# Patient Record
Sex: Female | Born: 1975 | Race: White | Hispanic: No | Marital: Married | Smoking: Current every day smoker
Health system: Southern US, Community
[De-identification: ages and names within clinical notes are randomized; demographics above are authoritative.]

## PROBLEM LIST (undated history)

## (undated) DIAGNOSIS — F329 Major depressive disorder, single episode, unspecified: Secondary | ICD-10-CM

## (undated) DIAGNOSIS — F419 Anxiety disorder, unspecified: Secondary | ICD-10-CM

## (undated) DIAGNOSIS — F32A Depression, unspecified: Secondary | ICD-10-CM

## (undated) DIAGNOSIS — R112 Nausea with vomiting, unspecified: Secondary | ICD-10-CM

## (undated) DIAGNOSIS — I10 Essential (primary) hypertension: Secondary | ICD-10-CM

## (undated) HISTORY — PX: ABDOMINAL HYSTERECTOMY: SHX81

## (undated) HISTORY — PX: OOPHORECTOMY: SHX86

---

## 2006-03-30 ENCOUNTER — Ambulatory Visit: Payer: Self-pay | Admitting: Gynecology

## 2006-03-30 ENCOUNTER — Encounter (INDEPENDENT_AMBULATORY_CARE_PROVIDER_SITE_OTHER): Payer: Self-pay | Admitting: Gynecology

## 2006-07-07 ENCOUNTER — Emergency Department: Payer: Self-pay | Admitting: Emergency Medicine

## 2006-07-07 ENCOUNTER — Other Ambulatory Visit: Payer: Self-pay

## 2007-11-22 ENCOUNTER — Encounter (INDEPENDENT_AMBULATORY_CARE_PROVIDER_SITE_OTHER): Payer: Self-pay | Admitting: Gynecology

## 2007-11-22 ENCOUNTER — Ambulatory Visit: Payer: Self-pay | Admitting: Gynecology

## 2009-09-12 ENCOUNTER — Ambulatory Visit: Payer: Self-pay | Admitting: Obstetrics and Gynecology

## 2009-09-18 ENCOUNTER — Ambulatory Visit: Payer: Self-pay | Admitting: Obstetrics and Gynecology

## 2010-09-16 ENCOUNTER — Emergency Department: Payer: Self-pay | Admitting: Internal Medicine

## 2011-02-11 ENCOUNTER — Ambulatory Visit: Payer: Self-pay | Admitting: Internal Medicine

## 2011-12-31 ENCOUNTER — Ambulatory Visit: Payer: Self-pay | Admitting: Family Medicine

## 2011-12-31 LAB — CBC WITH DIFFERENTIAL/PLATELET
Eosinophil #: 0 10*3/uL (ref 0.0–0.7)
Eosinophil %: 0.5 %
Lymphocyte %: 27.2 %
MCH: 30.4 pg (ref 26.0–34.0)
MCV: 89 fL (ref 80–100)
Monocyte %: 5 %
Neutrophil #: 5.8 10*3/uL (ref 1.4–6.5)
Neutrophil %: 66.7 %
Platelet: 315 10*3/uL (ref 150–440)
RDW: 13.2 % (ref 11.5–14.5)
WBC: 8.7 10*3/uL (ref 3.6–11.0)

## 2012-06-30 IMAGING — CT CT CHEST W/ CM
1 series · 16 of 32 positions shown, 20 images · IV contrast (APPLIED)
Comparison: None

REASON FOR EXAM: sob
COMMENTS:

PROCEDURE:     CT  - CT CHEST (FOR PE) W  - September 16, 2010  [DATE]
RESULT:     Indications: Shortness of breath
TECHNIQUE: A thin-section spiral CT from the lung apices to the upper
abdomen was acquired on a multi slice scanner following 73 ml [id number]
intravenous contrast. These images were then transferred to the Siemens work
station and were subsequently reviewed utilizing 3-D reconstructions and MIP
images.

[Series 5: soft tissue · axial · 0.69mm/px · z∈[-278,-14]mm · 16 of 96 slices shown, 20 images]
[im 4/96  mediastinal]
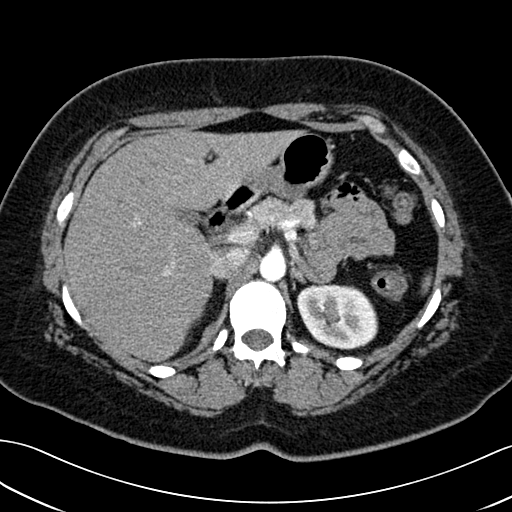
[im 4/96  lung]
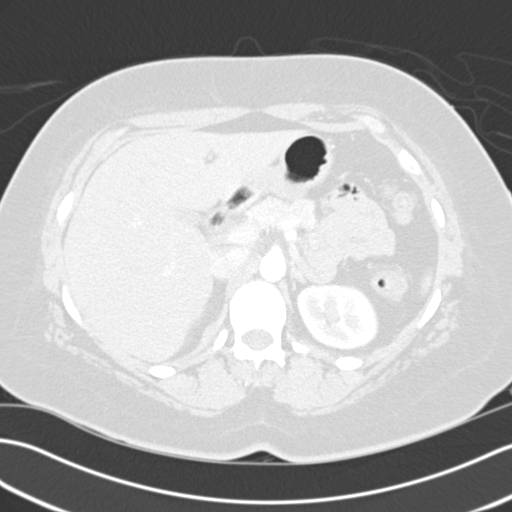
[im 11/96  lung]
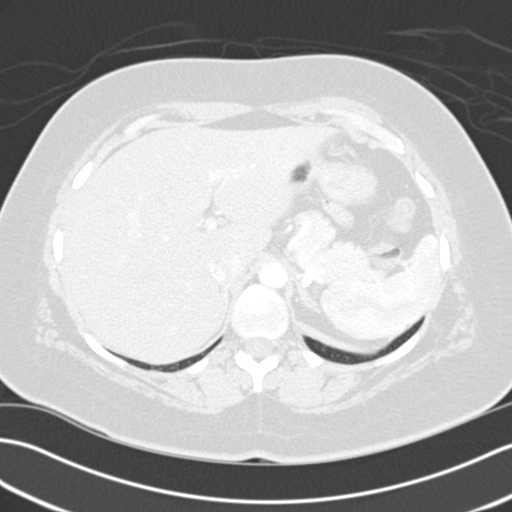
[im 18/96  lung]
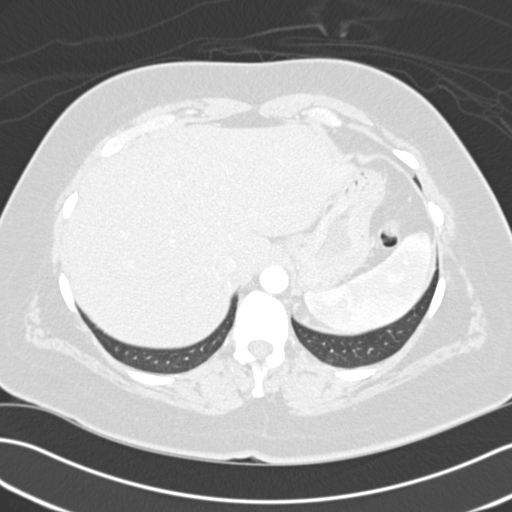
[im 22/96  lung]
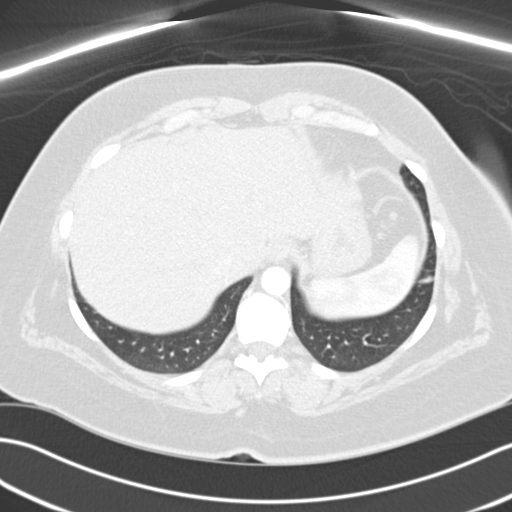
[im 29/96  mediastinal]
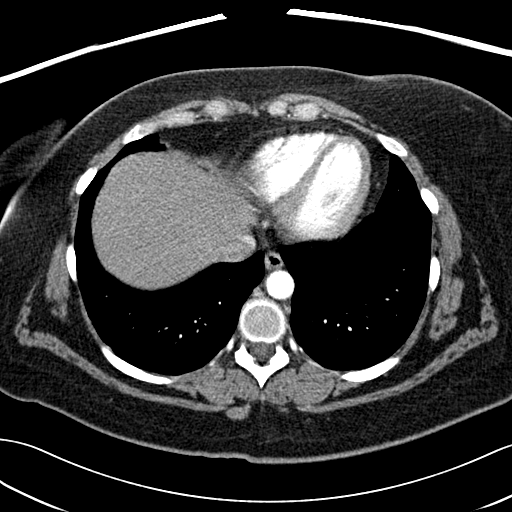
[im 29/96  lung]
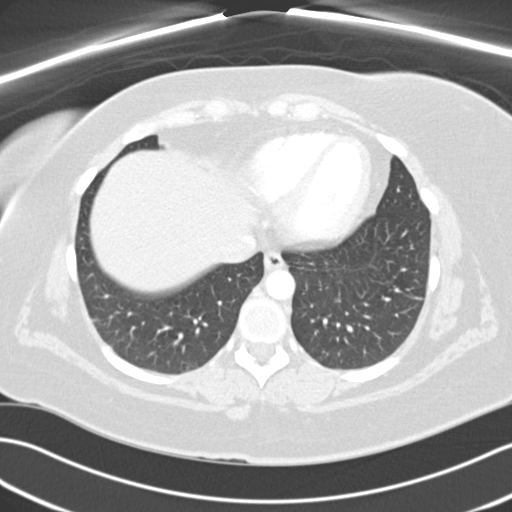
[im 36/96  lung]
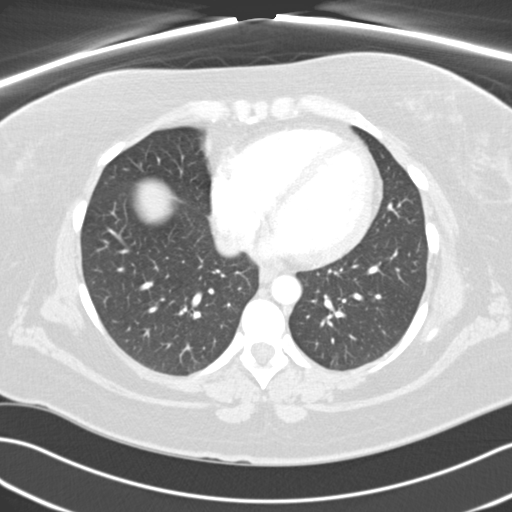
[im 43/96  lung]
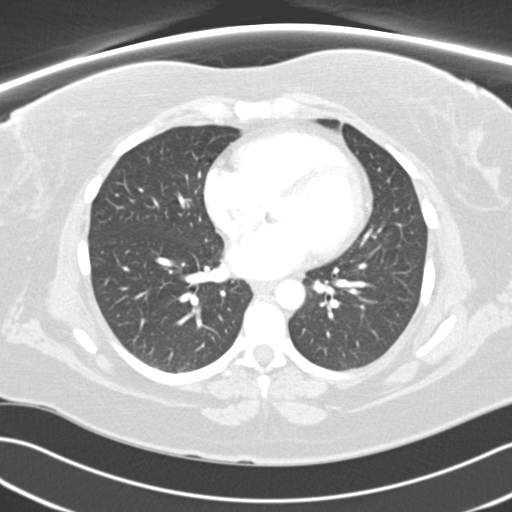
[im 50/96  lung]
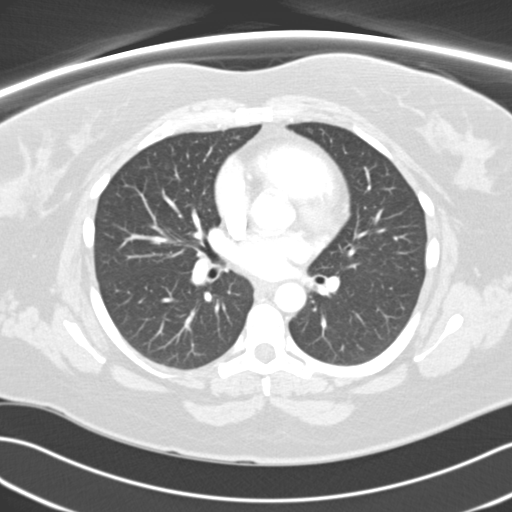
[im 51/96  mediastinal]
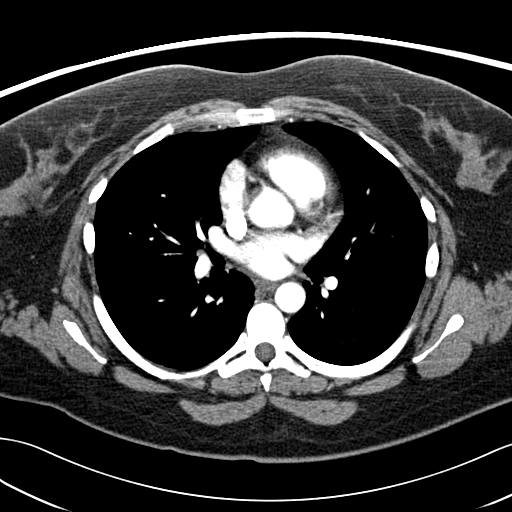
[im 51/96  lung]
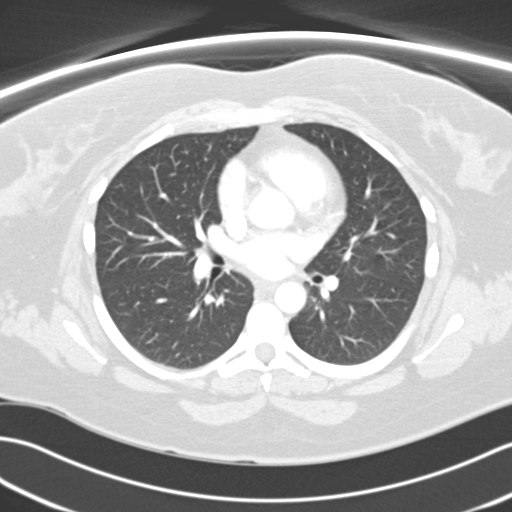
[im 57/96  lung]
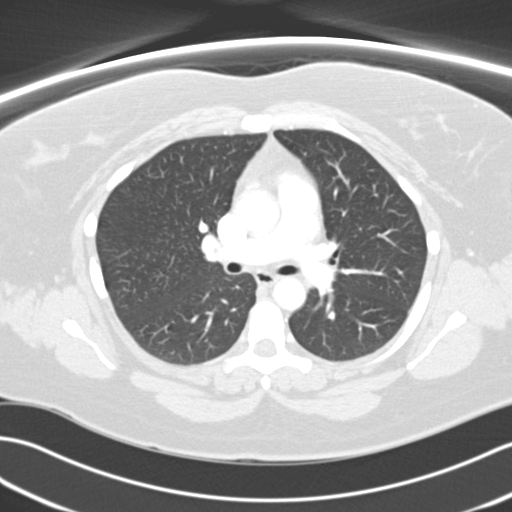
[im 60/96  lung]
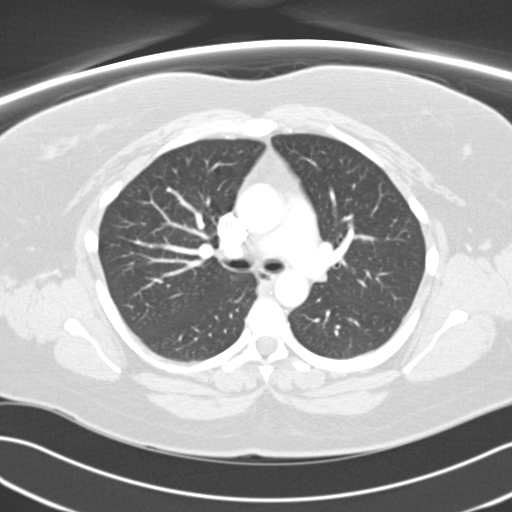
[im 67/96  lung]
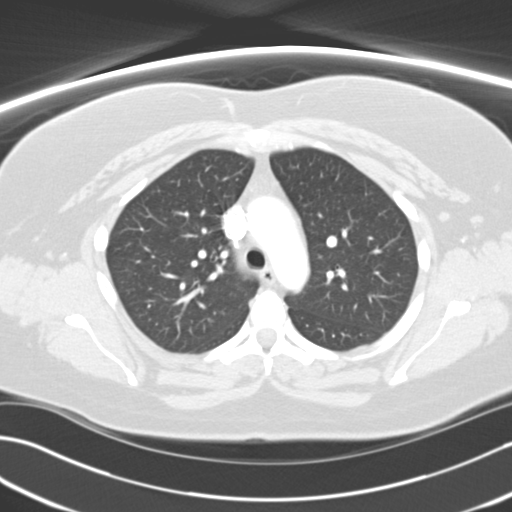
[im 74/96  mediastinal]
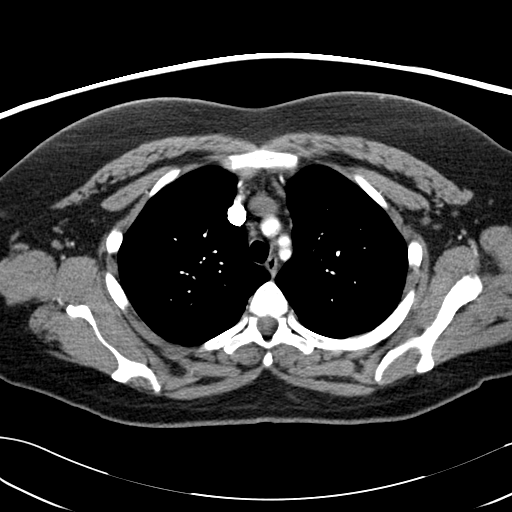
[im 74/96  lung]
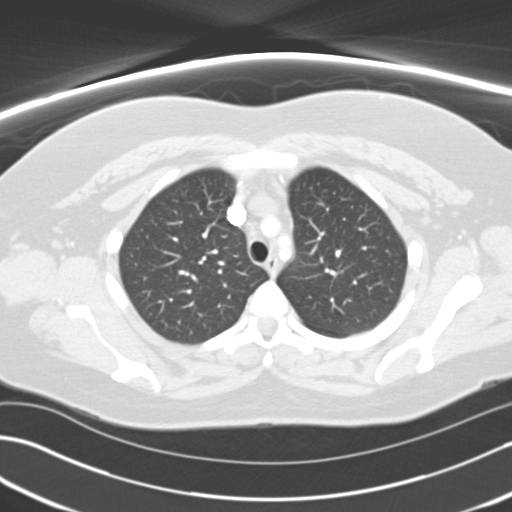
[im 78/96  lung]
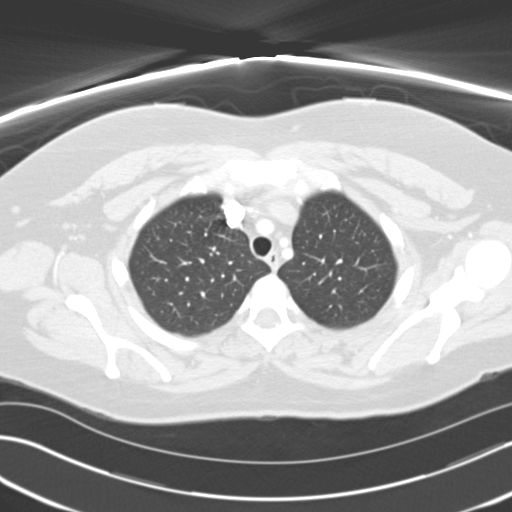
[im 85/96  lung]
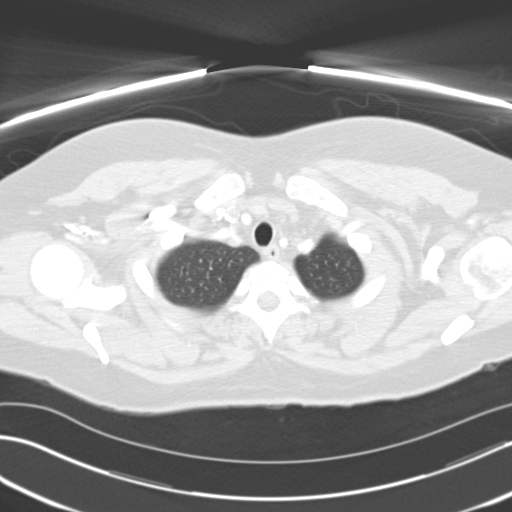
[im 92/96  lung]
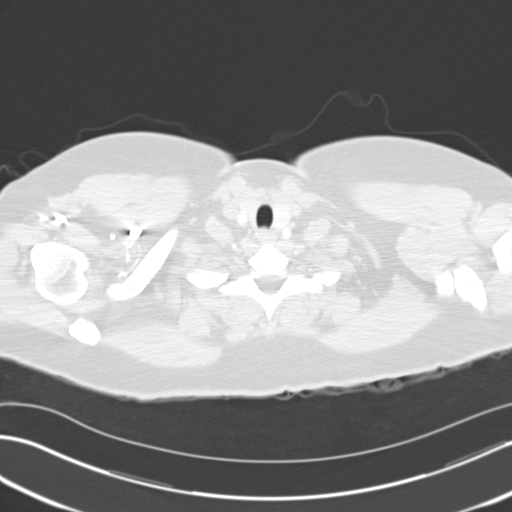

[16 of 32 positions shown; findings below may reference images not displayed]

FINDINGS: There is adequate opacification of the pulmonary arteries. There is no
pulmonary embolus. The main pulmonary artery, right main pulmonary artery,
and left main pulmonary arteries are normal in size. The heart size is
normal. There is no pericardial effusion.

The lungs are clear. There is no focal consolidation, pleural effusion, or
pneumothorax.

There is no axillary, hilar, or mediastinal adenopathy.

The osseous structures are unremarkable.

The visualized portions of the upper abdomen are unremarkable.
IMPRESSION: 1. No CT evidence of pulmonary embolus.

## 2012-06-30 IMAGING — CR DG CHEST 2V
1 series · 2 of 2 positions shown · non-contrast
Comparison: none

REASON FOR EXAM: SOB
COMMENTS:

[Series 1: view not recorded · 0.17mm/px · 2 of 2 slices shown]
[im 1/2]
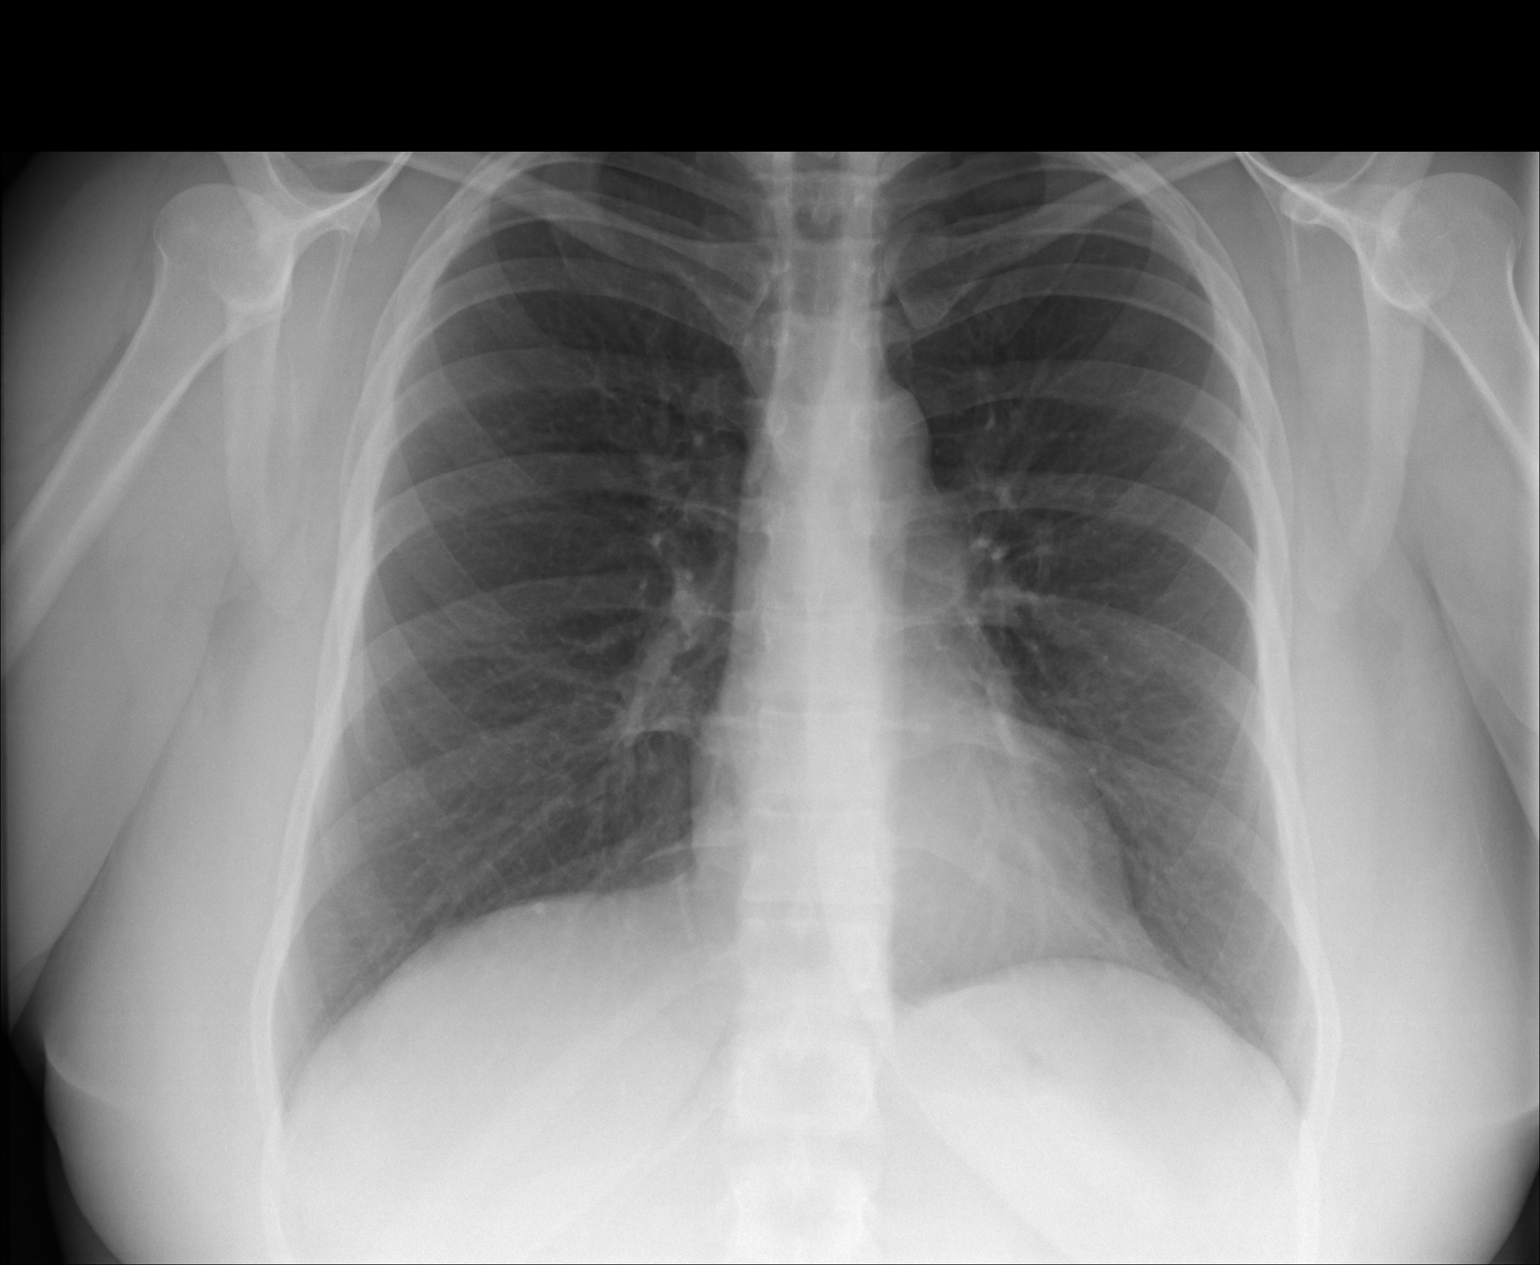
[im 2/2]
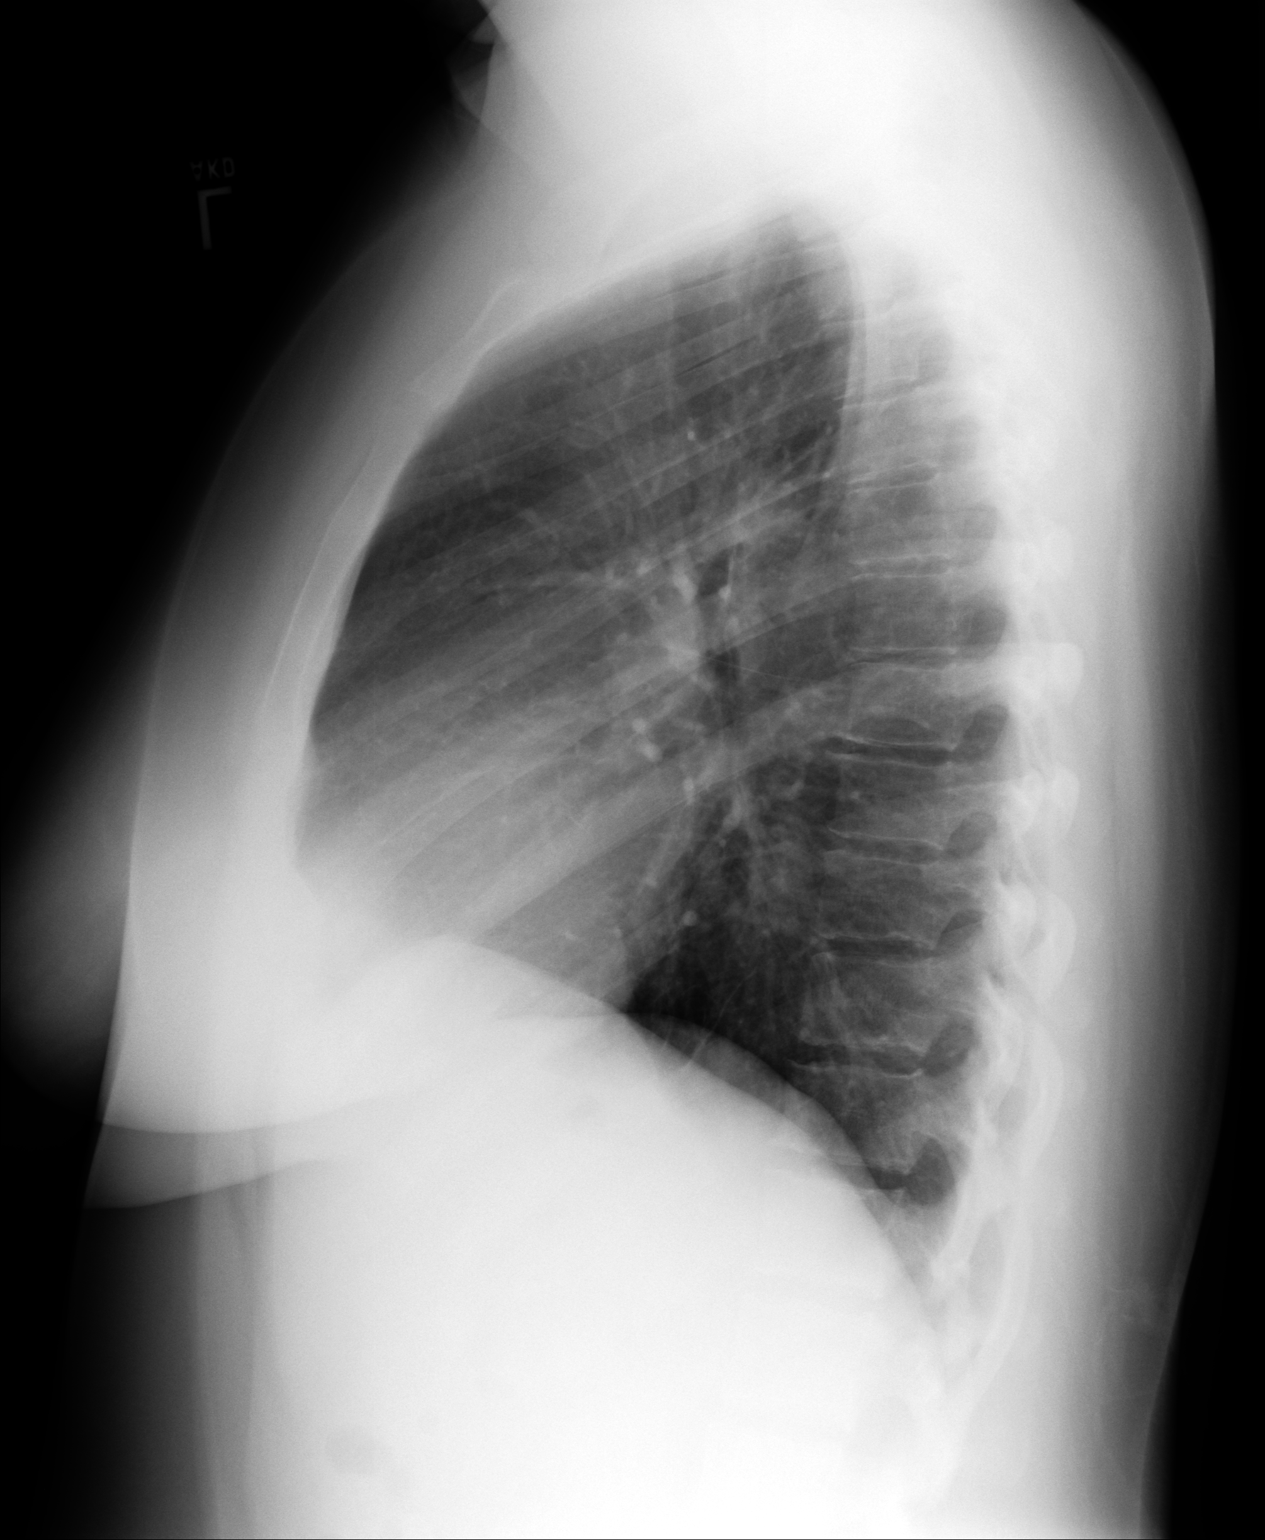

[2 of 2 positions shown; findings below may reference images not displayed]

PROCEDURE:     DXR - DXR CHEST PA (OR AP) AND LATERAL  - September 16, 2010 [DATE]

RESULT:     There is no previous exam for comparison.

The lungs are clear. The heart and pulmonary vessels are normal. The bony
and mediastinal structures are unremarkable. There is no effusion. There is
no pneumothorax or evidence of congestive failure.
IMPRESSION: No acute cardiopulmonary disease.

## 2012-10-18 ENCOUNTER — Ambulatory Visit: Payer: Self-pay

## 2012-12-20 ENCOUNTER — Inpatient Hospital Stay: Payer: Self-pay | Admitting: Psychiatry

## 2012-12-20 LAB — COMPREHENSIVE METABOLIC PANEL
Albumin: 3.9 g/dL (ref 3.4–5.0)
BUN: 9 mg/dL (ref 7–18)
Co2: 28 mmol/L (ref 21–32)
EGFR (African American): >60
EGFR (Non-African Amer.): >60
Osmolality: 276 (ref 275–301)
Potassium: 4 mmol/L (ref 3.5–5.1)
Total Protein: 7.7 g/dL (ref 6.4–8.2)

## 2012-12-20 LAB — CBC
HCT: 41.4 % (ref 35.0–47.0)
MCH: 30.2 pg (ref 26.0–34.0)
MCHC: 34.3 g/dL (ref 32.0–36.0)
MCV: 88 fL (ref 80–100)
Platelet: 331 10*3/uL (ref 150–440)
RDW: 12.9 % (ref 11.5–14.5)
WBC: 8.2 10*3/uL (ref 3.6–11.0)

## 2012-12-20 LAB — URINALYSIS, COMPLETE
Bilirubin,UR: NEGATIVE
Leukocyte Esterase: NEGATIVE
RBC,UR: <1 /HPF (ref 0–5)
Specific Gravity: 1.003 (ref 1.003–1.030)
WBC UR: NONE SEEN /HPF (ref 0–5)

## 2012-12-20 LAB — ETHANOL: Ethanol: <3 mg/dL

## 2012-12-20 LAB — DRUG SCREEN, URINE
Amphetamines, Ur Screen: NEGATIVE (ref ?–1000)
Benzodiazepine, Ur Scrn: NEGATIVE (ref ?–200)
Cannabinoid 50 Ng, Ur ~~LOC~~: NEGATIVE (ref ?–50)
Cocaine Metabolite,Ur ~~LOC~~: NEGATIVE (ref ?–300)

## 2014-12-01 NOTE — Discharge Summary (Signed)
PATIENT NAME:  Brianna Abbott, [name] MR#:  [id number] DATE OF BIRTH:  [birth year]  DATE OF ADMISSION:  12/20/2012 DATE OF DISCHARGE:  12/22/2012  HOSPITAL COURSE: See dictated history and physical for details of admission. This 39 year old woman came to the Emergency Room voluntarily with family reporting suicidal thoughts appear to anxiety, feeling like she was no longer able to function. Initially she was very tearful and withdrawn and anxious. Supportive and educational therapy was done with the patient. She was involved in groups on the unit. After discussing her history of medication with her, it appeared that duloxetine she had been taking for over year was not proving very effective. She gave me a history having had a good response to bupropion in the past. Based on this, we discontinue the duloxetine, which could be done because she really had not been very compliant with it recently anyway and started her on Wellbutrin extended-release 150 mg in the morning. Whether it had anything to medicine or not, the following days she reported that she was feeling much better. On the day of discharge, the patient appears to be calm. She is smiling. She reports that her mood is much better. She completely denies any suicidal ideation. She is able to report multiple things in her life, especially her family and children. that are worth living for. The patient is agreeable to continuing with outpatient treatment, but will be referred to mental health providers in the community, rather than continuing to get mental health treatment from her primary care doctor. She has an [facility] with Dr. Janeece RiggersSu on the 20th.   DISCHARGE MEDICATIONS: Bupropion extended-release 300 mg per day.   LABORATORY RESULTS: Screen on admission was all negative. A urinalysis was normal. TSH normal at 1.28. Alcohol not detected. Chemistry panel showed a mildly elevated glucose at 136 on a nonfasting  draw. CBC normal.   MENTAL STATUS AT DISCHARGE: Moderately overweight woman, looks her stated age, cooperative with the interview. Well-groomed, casually dressed. Good eye contact. Normal psychomotor activity. Speech is normal in rate, tone and volume. The patient is smiling, upbeat, appropriately reactive. Mood stated as being much better. Thoughts appear to be generally lucid without any loosening of associations or bizarre thoughts. Thoughts are well organized. She denied auditory or visual hallucinations. She denied suicidal or homicidal ideation. She shows adequate judgment and insight, normal intelligence. Alert and oriented x 4.   DISCHARGE DIAGNOSES, PRINCIPAL AND PRIMARY: AXIS I:  Major depression, moderate, recurrent.   SECONDARY DIAGNOSES: AXIS I: Rule out posttraumatic stress disorder.   AXIS II: Deferred.   AXIS III: Moderate obesity.   AXIS IV: Moderate from chronic burden of illness, past history of trauma.   AXIS V: Functioning at time of evaluation and discharge: 59    ____________________________ Audery AmelJohn T. Clapacs, MD jtc:cc D: 12/22/2012 21:51:11 ET T: 12/22/2012 23:11:08 ET JOB#: [id number]  cc: Audery AmelJohn T. Clapacs, MD, <Dictator> Audery AmelJOHN T CLAPACS MD ELECTRONICALLY SIGNED 12/23/2012 14:23
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
## 2015-01-20 ENCOUNTER — Emergency Department: Payer: 59

## 2015-01-20 ENCOUNTER — Other Ambulatory Visit: Payer: Self-pay

## 2015-01-20 ENCOUNTER — Emergency Department
Admission: EM | Admit: 2015-01-20 | Discharge: 2015-01-20 | Disposition: A | Discharge status: Home / Self Care | Payer: 59 | Attending: Emergency Medicine | Admitting: Emergency Medicine

## 2015-01-20 ENCOUNTER — Encounter: Payer: Self-pay | Admitting: Emergency Medicine

## 2015-01-20 DIAGNOSIS — Z79899 Other long term (current) drug therapy: Secondary | ICD-10-CM | POA: Diagnosis not present

## 2015-01-20 DIAGNOSIS — Z720 Tobacco use: Secondary | ICD-10-CM | POA: Insufficient documentation

## 2015-01-20 DIAGNOSIS — R079 Chest pain, unspecified: Secondary | ICD-10-CM

## 2015-01-20 DIAGNOSIS — R0789 Other chest pain: Secondary | ICD-10-CM | POA: Insufficient documentation

## 2015-01-20 HISTORY — DX: Anxiety disorder, unspecified: F41.9

## 2015-01-20 HISTORY — DX: Depression, unspecified: F32.A

## 2015-01-20 HISTORY — DX: Major depressive disorder, single episode, unspecified: F32.9

## 2015-01-20 LAB — COMPREHENSIVE METABOLIC PANEL
ALBUMIN: 4.1 g/dL (ref 3.5–5.0)
ALT: 25 U/L (ref 14–54)
ANION GAP: 10 (ref 5–15)
AST: 28 U/L (ref 15–41)
Alkaline Phosphatase: 63 U/L (ref 38–126)
BUN: 11 mg/dL (ref 6–20)
CHLORIDE: 104 mmol/L (ref 101–111)
CO2: 25 mmol/L (ref 22–32)
Calcium: 9 mg/dL (ref 8.9–10.3)
Creatinine, Ser: 0.74 mg/dL (ref 0.44–1.00)
GFR calc Af Amer: >60 mL/min (ref >60)
Glucose, Bld: 106 mg/dL — ABNORMAL HIGH (ref 65–99)
Potassium: 3.7 mmol/L (ref 3.5–5.1)
Sodium: 139 mmol/L (ref 135–145)
TOTAL PROTEIN: 7.2 g/dL (ref 6.5–8.1)
Total Bilirubin: 0.3 mg/dL (ref 0.3–1.2)

## 2015-01-20 LAB — CBC WITH DIFFERENTIAL/PLATELET
Basophils Absolute: 0 10*3/uL (ref 0–0.1)
Basophils Relative: 0 %
EOS PCT: 0 %
Eosinophils Absolute: 0 10*3/uL (ref 0–0.7)
HCT: 39.9 % (ref 35.0–47.0)
HEMOGLOBIN: 13.8 g/dL (ref 12.0–16.0)
Lymphocytes Relative: 18 %
Lymphs Abs: 1.9 10*3/uL (ref 1.0–3.6)
MCH: 32.8 pg (ref 26.0–34.0)
MCHC: 34.6 g/dL (ref 32.0–36.0)
MCV: 94.6 fL (ref 80.0–100.0)
Monocytes Absolute: 0.5 10*3/uL (ref 0.2–0.9)
Monocytes Relative: 5 %
NEUTROS ABS: 8 10*3/uL — AB (ref 1.4–6.5)
NEUTROS PCT: 77 %
Platelets: 318 10*3/uL (ref 150–440)
RBC: 4.22 MIL/uL (ref 3.80–5.20)
RDW: 13.2 % (ref 11.5–14.5)
WBC: 10.5 10*3/uL (ref 3.6–11.0)

## 2015-01-20 LAB — TROPONIN I

## 2015-01-20 LAB — FIBRIN DERIVATIVES D-DIMER (ARMC ONLY): FIBRIN DERIVATIVES D-DIMER (ARMC): 251 (ref 0–499)

## 2015-01-20 LAB — LIPASE, BLOOD: Lipase: 44 U/L (ref 22–51)

## 2015-01-20 MED ORDER — MORPHINE SULFATE 4 MG/ML IJ SOLN
4.0000 mg | Freq: Once | INTRAMUSCULAR | Status: AC
  Administered 2015-01-20: 4 mg via INTRAVENOUS

## 2015-01-20 MED ORDER — HYDROCODONE-ACETAMINOPHEN 5-325 MG PO TABS: 1.0000 | ORAL_TABLET | ORAL | Status: DC | PRN

## 2015-01-20 MED ORDER — ONDANSETRON HCL 4 MG/2ML IJ SOLN: 4.0000 mg | Freq: Once | INTRAMUSCULAR | Status: DC

## 2015-01-20 MED ORDER — MORPHINE SULFATE 4 MG/ML IJ SOLN
INTRAMUSCULAR | Status: AC
  Administered 2015-01-20: 4 mg via INTRAVENOUS
  Filled 2015-01-20: qty 1

## 2015-01-20 MED ORDER — ONDANSETRON HCL 4 MG/2ML IJ SOLN
INTRAMUSCULAR | Status: AC
  Filled 2015-01-20: qty 2

## 2015-01-20 MED ORDER — MORPHINE SULFATE 4 MG/ML IJ SOLN: 4.0000 mg | Freq: Once | INTRAMUSCULAR | Status: DC

## 2015-01-20 NOTE — ED Provider Notes (Signed)
[facility] Emergency Department Provider Note  Time seen: 6:56 PM  I have reviewed the triage vital signs and the nursing notes.   HISTORY  Chief Complaint Chest Pain    HPI Zakaria C Barg is a 39 y.o. female with a past medical history of anxiety and depression presents the emergency department with chest pain since this morning. According to the patient she was awoken approximate 6 AM with chest pain which she states is midsternal radiating to her back. She describes the pain as a 10/10 and constant. Worse with deep inspiration or coughing. She has been coughing but states that his chest today only. No sputum production. No unilateral leg swelling or calf pain recently. No history of PE or DVT in the past. No history of MI or heart disease. Patient states she does not get chest pain typically.    Past Medical History  Diagnosis Date  . Anxiety   . Depression     There are no active problems to display for this patient.   Past Surgical History  Procedure Laterality Date  . Abdominal hysterectomy      Current Outpatient Rx  Name  Route  Sig  Dispense  Refill  . buPROPion (WELLBUTRIN SR) 150 MG 12 hr tablet   Oral   Take 150 mg by mouth 2 (two) times daily.         . busPIRone (BUSPAR) 10 MG tablet   Oral   Take 10 mg by mouth 2 (two) times daily.         . sertraline (ZOLOFT) 50 MG tablet   Oral   Take 50 mg by mouth daily.           Allergies Review of patient's allergies indicates no known allergies.  No family history on file.  Social History History  Substance Use Topics  . Smoking status: Smoker, Current Status Unknown  . Smokeless tobacco: Not on file  . Alcohol Use: Yes    Review of Systems Constitutional: Negative for fever. Cardiovascular: The for chest pain. Respiratory: Negative for shortness of breath. Gastrointestinal: Negative for abdominal pain, vomiting and diarrhea. Musculoskeletal: Pain starts in the  chest but radiates to the mid/upper back. Neurological: Negative for headache  10-point ROS otherwise negative.  ____________________________________________   PHYSICAL EXAM:  VITAL SIGNS: ED Triage Vitals  Enc Vitals Group     BP 01/20/15 1644 145/102 mmHg     Pulse Rate 01/20/15 1644 95     Resp 01/20/15 1820 18     Temp 01/20/15 1644 98.8 F (37.1 C)     Temp Source 01/20/15 1644 Oral     SpO2 01/20/15 1644 95 %     Weight 01/20/15 1644 210 lb (95.255 kg)     Height 01/20/15 1644 5\' 4"  (1.626 m)     Head Cir --      Peak Flow --      Pain Score 01/20/15 1644 10     Pain Loc --      Pain Edu? --      Excl. in GC? --     Constitutional: Alert and oriented. Mild distress, holding her chest. ENT   Head: Normocephalic and atraumatic   Mouth/Throat: Mucous membranes are moist. Cardiovascular: Normal rate, regular rhythm. No murmur Respiratory: Normal respiratory effort without tachypnea nor retractions. Breath sounds are clear and equal bilaterally. No wheezes/rales/rhonchi. Gastrointestinal: Soft and nontender. No distention.   Musculoskeletal: Nontender with normal range of motion in all  extremities. No lower extremity tenderness or edema. Neurologic:  Normal speech and language. No gross focal neurologic deficits are appreciated. Speech is normal. Skin:  Skin is warm, dry and intact.  Psychiatric: Mood and affect are normal. Speech and behavior are normal.  ____________________________________________    EKG  EKG reviewed and interpreted myself shows normal sinus rhythm at 91 bpm, narrow QRS, normal axis, normal intervals, no ST changes noted. Overall normal EKG.  ____________________________________________    RADIOLOGY  Chest x-ray within normal limits  ____________________________________________    INITIAL IMPRESSION / ASSESSMENT AND PLAN / ED COURSE  Pertinent labs & imaging results that were available during my care of the patient were  reviewed by me and considered in my medical decision making (see chart for details).  39 year old female with central chest pain radiating to her back, rate the pain as a 10/10. Labs so far are within normal limits including troponin. EKG is normal. Vital signs are normal. Patient is perc negative. I discussed this with the patient, we will add on a d-dimer to help evaluate for a blood clot given her persistent pain. We will treat pain with morphine/Zofran, and reassess  ----------------------------------------- 7:53 PM on 01/20/2015 -----------------------------------------  Labs are within normal limits, negative d-dimer. Lipase is borderline at 44. I discussed with the patient plenty of fluids, limiting fatty/greasy foods, and no alcohol. Patient does drink alcohol several times per week but not on a daily occurrence. No right upper quadrant tenderness palpation, and currently no epigastric tenderness either. We will discharge the patient home on a medication as needed, and follow-up with her primary care doctor. I discussed very strict return precautions for the patient which she is agreeable. ____________________________________________   FINAL CLINICAL IMPRESSION(S) / ED DIAGNOSES  Chest pain   Minna Antis, MD 01/20/15 1954

## 2015-01-20 NOTE — Discharge Instructions (Signed)

## 2015-01-20 NOTE — ED Notes (Signed)
Pt states that she was awakened this am with chest pain that radiates through her chest to her back, pt states that it hurts to breathe, states that she is unable to take a deep breath, pt also states that she has had some ear pressure with congestion over the past few weeks

## 2015-06-29 ENCOUNTER — Ambulatory Visit
Admission: EM | Admit: 2015-06-29 | Discharge: 2015-06-29 | Disposition: A | Discharge status: Home / Self Care | Payer: 59 | Attending: Family Medicine | Admitting: Family Medicine

## 2015-06-29 DIAGNOSIS — N390 Urinary tract infection, site not specified: Secondary | ICD-10-CM

## 2015-06-29 LAB — URINALYSIS COMPLETE WITH MICROSCOPIC (ARMC ONLY)
Bilirubin Urine: NEGATIVE
GLUCOSE, UA: NEGATIVE mg/dL
Ketones, ur: NEGATIVE mg/dL
Nitrite: POSITIVE — AB
PROTEIN: NEGATIVE mg/dL
pH: 6 (ref 5.0–8.0)

## 2015-06-29 MED ORDER — SULFAMETHOXAZOLE-TRIMETHOPRIM 800-160 MG PO TABS: 1.0000 | ORAL_TABLET | Freq: Two times a day (BID) | ORAL | Status: AC

## 2015-06-29 NOTE — Discharge Instructions (Signed)

## 2015-06-29 NOTE — ED Notes (Signed)
Hx of UTI. Started 2 days ago with urinary frequency and burning. C/o suprapubic and low back pain

## 2015-06-29 NOTE — ED Provider Notes (Signed)
CSN: [id number]     Arrival date & time 06/29/15  1111 History   First MD Initiated Contact with Patient 06/29/15 1252     Chief Complaint  Patient presents with  . Cystitis   HPI  Brianna Abbott is a pleasant 39 y.o. female who presents with acute onset of dysuria and suprapubic pain. She tells me 2 days ago she began to have a severe dysuria, without hematuria. She describes a pressure-like pain in her lower abdomen. Pain is 10/10 at worse. She tried Azo at home without any help. She has history of frequent urinary tract infections and reports actually 5 this year. She has not recently been seen by urologist. She is status post hysterectomy. She gives history of overactive bladder. She denies any vaginal discharge. Denies any fever or chills. Denies any back pain.  Past Medical History  Diagnosis Date  . Anxiety   . Depression    Past Surgical History  Procedure Laterality Date  . Abdominal hysterectomy     History reviewed. No pertinent family history. Social History  Substance Use Topics  . Smoking status: Current Every Day Smoker -- 1.00 packs/day    Types: Cigarettes  . Smokeless tobacco: None  . Alcohol Use: Yes     Comment: socially   OB History    No data available     Review of Systems  Constitutional: Negative.   HENT: Negative.   Eyes: Negative.   Respiratory: Negative.   Cardiovascular: Negative.   Gastrointestinal: Negative.   Endocrine: Negative.   Genitourinary: Positive for dysuria, urgency and frequency. Negative for hematuria, flank pain, vaginal bleeding and vaginal discharge.  Musculoskeletal: Negative.   Skin: Negative.   Neurological: Negative.   Hematological: Negative.   Psychiatric/Behavioral: Negative.     Allergies  Review of patient's allergies indicates no known allergies.  Home Medications   Prior to Admission medications   Medication Sig Start Date End Date Taking? Authorizing Provider  ARIPiprazole (ABILIFY) 2 MG tablet Take 2 mg  by mouth daily.   Yes Historical Provider, MD  busPIRone (BUSPAR) 15 MG tablet Take 15 mg by mouth 2 (two) times daily.   Yes Historical Provider, MD  escitalopram (LEXAPRO) 5 MG tablet Take 5 mg by mouth daily.   Yes Historical Provider, MD  Fish Oil-Cholecalciferol (FISH OIL + D3 PO) Take by mouth.   Yes Historical Provider, MD  geriatric multivitamins-minerals (ELDERTONIC/GEVRABON) ELIX Take 15 mLs by mouth daily.   Yes Historical Provider, MD  buPROPion (WELLBUTRIN SR) 150 MG 12 hr tablet Take 150 mg by mouth 2 (two) times daily.    Historical Provider, MD  busPIRone (BUSPAR) 10 MG tablet Take 10 mg by mouth 2 (two) times daily.    Historical Provider, MD  HYDROcodone-acetaminophen (NORCO/VICODIN) 5-325 MG per tablet Take 1 tablet by mouth every 4 (four) hours as needed for moderate pain. 01/20/15   Minna Antis, MD  sertraline (ZOLOFT) 50 MG tablet Take 50 mg by mouth daily.    Historical Provider, MD  sulfamethoxazole-trimethoprim (BACTRIM DS,SEPTRA DS) 800-160 MG tablet Take 1 tablet by mouth 2 (two) times daily. 06/29/15 07/06/15  Joselyn Arrow, NP   Meds Ordered and Administered this Visit  Medications - No data to display  BP 132/99 mmHg  Pulse 105  Temp(Src) 97.3 F (36.3 C) (Tympanic)  Resp 16  Ht  (1.626 m)  Wt 200 lb (90.719 kg)  BMI 34.31 kg/m2  SpO2 99% No data found.   Physical Exam  Constitutional: She is oriented to person, place, and time. She appears well-developed and well-nourished. No distress.  HENT:  Head: Normocephalic and atraumatic.  Eyes: Conjunctivae are normal. No scleral icterus.  Neck: Normal range of motion. Neck supple. No thyromegaly present.  Cardiovascular: Normal rate and regular rhythm.   Pulmonary/Chest: Effort normal and breath sounds normal. No respiratory distress.  Abdominal: Soft. Normal appearance and bowel sounds are normal. She exhibits no distension. There is no splenomegaly or hepatomegaly. There is tenderness in the  suprapubic area. There is no rigidity, no rebound, no guarding and no CVA tenderness.  Musculoskeletal: Normal range of motion. She exhibits no edema or tenderness.  Neurological: She is alert and oriented to person, place, and time. No cranial nerve deficit.  Skin: Skin is warm and dry. No rash noted. She is not diaphoretic. No cyanosis or erythema. Nails show no clubbing.  Psychiatric: She has a normal mood and affect. Her behavior is normal. Judgment and thought content normal.  Nursing note and vitals reviewed.   ED Course  Procedures N/A  Labs Review Labs Reviewed  URINALYSIS COMPLETEWITH MICROSCOPIC ([facility] ONLY) - Abnormal; Notable for the following:    Color, Urine ORANGE (*)    Specific Gravity, Urine <1.005 (*)    Hgb urine dipstick 2+ (*)    Nitrite POSITIVE (*)    Leukocytes, UA 2+ (*)    Bacteria, UA FEW (*)    Squamous Epithelial / LPF 0-5 (*)    All other components within normal limits  URINE CULTURE   MDM   1. UTI (lower urinary tract infection)    Hx recurrent infections.    Plan: Test results and diagnosis reviewed with patient Rx as per orders;  benefits, risks, potential side effects reviewed  Recommend supportive treatment with rest, increase fluids, tylenol or ibuprofen PRN Followup with PCP for frequent UTIs Urine culture has been sent and we will call if antibiotic therapy needs to be changed, otherwise complete all of your antibiotics Seek additional medical care if symptoms worsen or are not improving     Joselyn ArrowKandice L Kenni Newton, NP 06/29/15 1310

## 2015-07-01 LAB — URINE CULTURE: Culture: >=100000

## 2015-07-09 ENCOUNTER — Telehealth: Payer: Self-pay | Admitting: Emergency Medicine

## 2015-07-09 NOTE — ED Notes (Signed)
Informed patient that her urine culture was positive for E. Coli.  Patient states that she took Septra for 7 days as prescribed.  Patient states that her symptoms have improved and denies any burning or pain with urinating.  Patient was instructed to follow-up here or with her PCP if her symptoms worsen for a repeat urinalysis.  Patient verbalized understanding.

## 2018-07-12 ENCOUNTER — Other Ambulatory Visit: Payer: Self-pay

## 2018-07-12 ENCOUNTER — Encounter: Payer: Self-pay | Admitting: Emergency Medicine

## 2018-07-12 ENCOUNTER — Ambulatory Visit
Admission: EM | Admit: 2018-07-12 | Discharge: 2018-07-12 | Disposition: A | Discharge status: Home / Self Care | Payer: BLUE CROSS/BLUE SHIELD | Attending: Family Medicine | Admitting: Family Medicine

## 2018-07-12 DIAGNOSIS — J029 Acute pharyngitis, unspecified: Secondary | ICD-10-CM | POA: Diagnosis not present

## 2018-07-12 LAB — RAPID STREP SCREEN (MED CTR MEBANE ONLY): STREPTOCOCCUS, GROUP A SCREEN (DIRECT): NEGATIVE

## 2018-07-12 MED ORDER — AMOXICILLIN 875 MG PO TABS: 875.0000 mg | ORAL_TABLET | Freq: Two times a day (BID) | ORAL | 0 refills | Status: DC

## 2018-07-12 NOTE — ED Provider Notes (Signed)
[facility] ____________________________________________  Time seen: Approximately 11:24 AM  I have reviewed the triage vital signs and the nursing notes.   HISTORY  Chief Complaint Sore Throat   HPI Brianna Abbott is a 42 y.o. female presenting for evaluation of sore throat present for almost 1 week, worsened in the last 3 days.  States initially she had a very mild sore throat that progressively worsened and states now throat feels more like swallowing razor blades and is moderately.  Overall continues to eat and drink well.  Denies any accompanying fevers.  Has not been take any over-the-counter medications for the same complaints.  Does have some seasonal allergies, but states that has not been an issue lately.  Denies any accompanying cough, runny nose, nasal congestion or other symptoms coming along with this.  Denies known sick contacts.  Denies other aggravating alleviating factors.  Reports otherwise doing well.     Past Medical History:  Diagnosis Date  . Anxiety   . Depression     There are no active problems to display for this patient.   Past Surgical History:  Procedure Laterality Date  . ABDOMINAL HYSTERECTOMY       No current facility-administered medications for this encounter.   Current Outpatient Medications:  .  ARIPiprazole (ABILIFY) 2 MG tablet, Take 2 mg by mouth daily., Disp: , Rfl:  .  buPROPion (WELLBUTRIN SR) 150 MG 12 hr tablet, Take 150 mg by mouth 2 (two) times daily., Disp: , Rfl:  .  busPIRone (BUSPAR) 15 MG tablet, Take 15 mg by mouth 2 (two) times daily., Disp: , Rfl:  .  TURMERIC PO, Take by mouth., Disp: , Rfl:  .  amoxicillin (AMOXIL) 875 MG tablet, Take 1 tablet (875 mg total) by mouth 2 (two) times daily., Disp: 20 tablet, Rfl: 0  Allergies Patient has no known allergies.  Family History  Problem Relation Age of Onset  . Healthy Mother   . Hypertension Father     Social History Social History   Tobacco Use    . Smoking status: Current Every Day Smoker    Packs/day: 1.00    Types: Cigarettes  . Smokeless tobacco: Never Used  Substance Use Topics  . Alcohol use: Yes    Comment: socially  . Drug use: Never    Review of Systems Constitutional: No fever ENT: positive sore throat. Cardiovascular: Denies chest pain. Respiratory: Denies shortness of breath. Gastrointestinal: No abdominal pain.   Musculoskeletal: Negative for back pain. Skin: Negative for rash.  ____________________________________________   PHYSICAL EXAM:  VITAL SIGNS: ED Triage Vitals  Enc Vitals Group     BP 07/12/18 1031 (!) 128/92     Pulse Rate 07/12/18 1031 96     Resp 07/12/18 1031 18     Temp 07/12/18 1031 98.1 F (36.7 C)     Temp Source 07/12/18 1031 Oral     SpO2 07/12/18 1031 99 %     Weight 07/12/18 1026 210 lb (95.3 kg)     Height 07/12/18 1026 5\' 4"  (1.626 m)     Head Circumference --      Peak Flow --      Pain Score 07/12/18 1026 8     Pain Loc --      Pain Edu? --      Excl. in GC? --     Constitutional: Alert and oriented. Well appearing and in no acute distress. Eyes: Conjunctivae are normal.  Head: Atraumatic. No sinus tenderness  to palpation. No swelling. No erythema.  Ears: no erythema, normal TMs bilaterally.   Nose:No nasal congestion   Mouth/Throat: Mucous membranes are moist. Moderate pharyngeal erythema.  Mild bilateral tonsillar swelling.  No exudate.  No uvular shift or deviation. Neck: No stridor.  No cervical spine tenderness to palpation. Hematological/Lymphatic/Immunilogical: Mild anterior bilateral cervical lymphadenopathy. Cardiovascular: Normal rate, regular rhythm. Grossly normal heart sounds.  Good peripheral circulation. Respiratory: Normal respiratory effort.  No retractions. No wheezes, rales or rhonchi. Good air movement.  Musculoskeletal: Ambulatory with steady gait.  Neurologic:  Normal speech and language. No gait instability. Skin:  Skin appears warm, dry and  intact. No rash noted. Psychiatric: Mood and affect are normal. Speech and behavior are normal.  ___________________________________________   LABS (all labs ordered are listed, but only abnormal results are displayed)  Labs Reviewed  RAPID STREP SCREEN ([facility] ONLY)  CULTURE, GROUP A STREP [facility])    PROCEDURES Procedures   INITIAL IMPRESSION / ASSESSMENT AND PLAN / ED COURSE  Pertinent labs & imaging results that were available during my care of the patient were reviewed by me and considered in my medical decision making (see chart for details).  Well-appearing patient.  No acute distress.  Quick strep negative, will culture.  Sore throat and absence of other accompanying symptoms, suspicious for strep pharyngitis.  Will await strep culture and empirically start oral amoxicillin.  Encourage rest, fluids, supportive care, over-the-counter Tylenol and ibuprofen as needed.  Discussed follow-up for continued complaints.Discussed indication, risks and benefits of medications with patient.  Discussed follow up with Primary care physician this week as needed. Discussed follow up and return parameters including no resolution or any worsening concerns. Patient verbalized understanding and agreed to plan.   ____________________________________________   FINAL CLINICAL IMPRESSION(S) / ED DIAGNOSES  Final diagnoses:  Pharyngitis, unspecified etiology     ED Discharge Orders         Ordered    amoxicillin (AMOXIL) 875 MG tablet  2 times daily     07/12/18 1116           Note: This dictation was prepared with Dragon dictation along with smaller phrase technology. Any transcriptional errors that result from this process are unintentional.         Renford DillsMiller, Deshante Cassell, NP 07/12/18 1128

## 2018-07-12 NOTE — Discharge Instructions (Addendum)
Take medication as prescribed. Rest. Drink plenty of fluids.  ° °Follow up with your primary care physician this week as needed. Return to Urgent care for new or worsening concerns.  ° °

## 2018-07-12 NOTE — ED Triage Notes (Signed)
Pt c/o sore throat. Has been sore for about a week but worse in the last 3-4 days. No other URI symptoms.

## 2018-07-15 LAB — CULTURE, GROUP A STREP (THRC)

## 2018-10-14 ENCOUNTER — Encounter: Payer: Self-pay | Admitting: Emergency Medicine

## 2018-10-14 ENCOUNTER — Ambulatory Visit
Admission: EM | Admit: 2018-10-14 | Discharge: 2018-10-14 | Disposition: A | Discharge status: Home / Self Care | Payer: BLUE CROSS/BLUE SHIELD | Attending: Family Medicine | Admitting: Family Medicine

## 2018-10-14 ENCOUNTER — Other Ambulatory Visit: Payer: Self-pay

## 2018-10-14 DIAGNOSIS — X500XXA Overexertion from strenuous movement or load, initial encounter: Secondary | ICD-10-CM | POA: Diagnosis not present

## 2018-10-14 DIAGNOSIS — S3992XA Unspecified injury of lower back, initial encounter: Secondary | ICD-10-CM

## 2018-10-14 HISTORY — DX: Essential (primary) hypertension: I10

## 2018-10-14 MED ORDER — MELOXICAM 15 MG PO TABS: 15.0000 mg | ORAL_TABLET | Freq: Every day | ORAL | 0 refills | Status: DC

## 2018-10-14 MED ORDER — METAXALONE 800 MG PO TABS: 800.0000 mg | ORAL_TABLET | Freq: Three times a day (TID) | ORAL | 0 refills | Status: DC

## 2018-10-14 MED ORDER — KETOROLAC TROMETHAMINE 60 MG/2ML IM SOLN
60.0000 mg | Freq: Once | INTRAMUSCULAR | Status: AC
  Administered 2018-10-14: 60 mg via INTRAMUSCULAR

## 2018-10-14 NOTE — ED Provider Notes (Signed)
[facility]    CSN: [id number] Arrival date & time: 10/14/18  2500     History   Chief Complaint Chief Complaint  Patient presents with  . Back Pain    HPI Brianna Abbott is a 43 y.o. female.   HPI  43 year old female presents with nonradiating lower back pain that she has had for the last 2 days.  She does not have any known injury but states that she works at [employer] and has to lift 30 pound boxes.  They have been busier than usual recently.  States certain motions seem to cause the muscle to "spasm".  She has not had any incontinence.  She has had low back injuries in the past that were treated symptomatically and improved.  Been taking ibuprofen at home and sitting on a heating pad none of which have seemed to provide her with any relief.         Past Medical History:  Diagnosis Date  . Anxiety   . Depression   . Hypertension     There are no active problems to display for this patient.   Past Surgical History:  Procedure Laterality Date  . ABDOMINAL HYSTERECTOMY      OB History   No obstetric history on file.      Home Medications    Prior to Admission medications   Medication Sig Start Date End Date Taking? Authorizing Provider  amLODipine (NORVASC) 10 MG tablet Take 10 mg by mouth daily. 04/21/18  Yes [provider]  ARIPiprazole (ABILIFY) 2 MG tablet Take 2 mg by mouth daily.   Yes [provider]  buPROPion (WELLBUTRIN SR) 150 MG 12 hr tablet Take 150 mg by mouth 2 (two) times daily.   Yes [provider]  busPIRone (BUSPAR) 15 MG tablet Take 15 mg by mouth 2 (two) times daily.   Yes [provider]  hydrochlorothiazide (HYDRODIURIL) 25 MG tablet  07/10/18  Yes [provider]  TURMERIC PO Take by mouth.   Yes [provider]  meloxicam (MOBIC) 15 MG tablet Take 1 tablet (15 mg total) by mouth daily. 10/14/18   Lutricia Feil, PA-C  metaxalone (SKELAXIN) 800 MG tablet  Take 1 tablet (800 mg total) by mouth 3 (three) times daily. 10/14/18   Lutricia Feil, PA-C    Family History Family History  Problem Relation Age of Onset  . Healthy Mother   . Hypertension Father     Social History Social History   Tobacco Use  . Smoking status: Current Every Day Smoker    Packs/day: 1.00    Types: Cigarettes  . Smokeless tobacco: Never Used  Substance Use Topics  . Alcohol use: Yes    Comment: socially  . Drug use: Never     Allergies   Patient has no known allergies.   Review of Systems Review of Systems  Constitutional: Positive for activity change. Negative for chills, fatigue and fever.  Musculoskeletal: Positive for back pain and myalgias.  All other systems reviewed and are negative.    Physical Exam Triage Vital Signs ED Triage Vitals  Enc Vitals Group     BP 10/14/18 0839 (!) 120/91     Pulse Rate 10/14/18 0839 (!) 109     Resp 10/14/18 0839 18     Temp 10/14/18 0839 98.1 F (36.7 C)     Temp Source 10/14/18 0839 Oral     SpO2 10/14/18 0839 100 %  Weight 10/14/18 0837 210 lb (95.3 kg)     Height 10/14/18 0837  (1.626 m)     Head Circumference --      Peak Flow --      Pain Score 10/14/18 0836 7     Pain Loc --      Pain Edu? --      Excl. in GC? --    No data found.  Updated Vital Signs BP (!) 120/91 (BP Location: Left Arm)   Pulse (!) 109   Temp 98.1 F (36.7 C) (Oral)   Resp 18   Ht  (1.626 m)   Wt 210 lb (95.3 kg)   SpO2 100%   BMI 36.05 kg/m   Visual Acuity Right Eye Distance:   Left Eye Distance:   Bilateral Distance:    Right Eye Near:   Left Eye Near:    Bilateral Near:     Physical Exam Vitals signs and nursing note reviewed.  Constitutional:      General: She is not in acute distress.    Appearance: Normal appearance. She is obese. She is not ill-appearing, toxic-appearing or diaphoretic.  HENT:     Head: Normocephalic.     Nose: Nose normal.  Eyes:     General:        Right  eye: No discharge.        Left eye: No discharge.     Conjunctiva/sclera: Conjunctivae normal.  Neck:     Musculoskeletal: Normal range of motion and neck supple. No neck rigidity or muscular tenderness.  Musculoskeletal:        General: Tenderness present.     Comments: Exam of the lumbar spine shows a slightly higher pelvis in stance on the right.  Patient forward flexes with her hands level of her knees and returns upright posture without too much difficulty.  However lateral flexion particularly to the right and left are both very painful and very limited.  Rotation also is limited and painful.  There is tenderness deep in the paraspinous muscles in the lower segment on the right.  Is able to toe and heel walk normally.  DTR's are 3+/4 but symmetrical.  Straight leg raise testing in the seated position is negative bilaterally at 90 degrees.  Skin:    General: Skin is warm and dry.  Neurological:     General: No focal deficit present.     Mental Status: She is alert and oriented to person, place, and time.  Psychiatric:        Mood and Affect: Mood normal.        Behavior: Behavior normal.        Thought Content: Thought content normal.        Judgment: Judgment normal.      UC Treatments / Results  Labs (all labs ordered are listed, but only abnormal results are displayed) Labs Reviewed - No data to display  EKG None  Radiology No results found.  Procedures Procedures (including critical care time)  Medications Ordered in UC Medications  ketorolac (TORADOL) injection 60 mg (60 mg Intramuscular Given 10/14/18 0930)    Initial Impression / Assessment and Plan / UC Course  I have reviewed the triage vital signs and the nursing notes.  Pertinent labs & imaging results that were available during my care of the patient were reviewed by me and considered in my medical decision making (see chart for details).   Patient should avoid symptoms as much as  possible.  Avoid  prolonged sitting or frequent bending and no lifting.  Prescribe Mobic and Skelaxin for pain.  Use ice and initially and then switch to alternating heat and ice as necessary for comfort.   Final Clinical Impressions(s) / UC Diagnoses   Final diagnoses:  Lumbosacral injury, initial encounter     Discharge Instructions     Apply ice 20 minutes out of every 2 hours 4-5 times daily for comfort. Use  Caution while taking muscle relaxers.  Do not perform activities requiring concentration or judgment and do not drive.  Avoid prolonged sitting ,frequent bending and no lifting.    ED Prescriptions    Medication Sig Dispense Auth. Provider   meloxicam (MOBIC) 15 MG tablet Take 1 tablet (15 mg total) by mouth daily. 30 tablet Lutricia Feil, PA-C   metaxalone (SKELAXIN) 800 MG tablet Take 1 tablet (800 mg total) by mouth 3 (three) times daily. 21 tablet Lutricia Feil, PA-C     Controlled Substance Prescriptions [place] Controlled Substance Registry consulted? Not Applicable   Lutricia Feil, PA-C 10/14/18 4196

## 2018-10-14 NOTE — Discharge Instructions (Signed)
Apply ice 20 minutes out of every 2 hours 4-5 times daily for comfort. Use  Caution while taking muscle relaxers.  Do not perform activities requiring concentration or judgment and do not drive.  Avoid prolonged sitting ,frequent bending and no lifting.

## 2018-10-14 NOTE — ED Triage Notes (Signed)
Pt c/o lower back pain. She woke up this was about 2 days ago. No known injury but she states pain is worse with movement and feels like she is having muscle spasms. Denies urinary symptoms.

## 2020-08-08 ENCOUNTER — Other Ambulatory Visit: Payer: Self-pay | Admitting: Nurse Practitioner

## 2020-08-08 DIAGNOSIS — Z1231 Encounter for screening mammogram for malignant neoplasm of breast: Secondary | ICD-10-CM

## 2020-10-29 ENCOUNTER — Other Ambulatory Visit: Payer: Self-pay

## 2020-10-29 ENCOUNTER — Ambulatory Visit
Admission: RE | Admit: 2020-10-29 | Discharge: 2020-10-29 | Disposition: A | Discharge status: Home / Self Care | Payer: BC Managed Care – PPO | Source: Ambulatory Visit | Attending: Nurse Practitioner | Admitting: Nurse Practitioner

## 2020-10-29 DIAGNOSIS — Z1231 Encounter for screening mammogram for malignant neoplasm of breast: Secondary | ICD-10-CM | POA: Diagnosis not present

## 2020-11-05 ENCOUNTER — Other Ambulatory Visit: Payer: Self-pay | Admitting: Nurse Practitioner

## 2020-11-05 DIAGNOSIS — R928 Other abnormal and inconclusive findings on diagnostic imaging of breast: Secondary | ICD-10-CM

## 2020-11-09 ENCOUNTER — Ambulatory Visit
Admission: RE | Admit: 2020-11-09 | Discharge: 2020-11-09 | Disposition: A | Discharge status: Home / Self Care | Payer: BC Managed Care – PPO | Source: Ambulatory Visit | Attending: Nurse Practitioner | Admitting: Nurse Practitioner

## 2020-11-09 ENCOUNTER — Other Ambulatory Visit: Payer: Self-pay

## 2020-11-09 DIAGNOSIS — R928 Other abnormal and inconclusive findings on diagnostic imaging of breast: Secondary | ICD-10-CM

## 2020-11-12 ENCOUNTER — Ambulatory Visit: Payer: BC Managed Care – PPO

## 2020-11-12 ENCOUNTER — Other Ambulatory Visit: Payer: BC Managed Care – PPO

## 2021-01-25 ENCOUNTER — Emergency Department: Payer: BC Managed Care – PPO

## 2021-01-25 ENCOUNTER — Other Ambulatory Visit: Payer: Self-pay

## 2021-01-25 ENCOUNTER — Emergency Department
Admission: EM | Admit: 2021-01-25 | Discharge: 2021-01-25 | Disposition: A | Discharge status: Home / Self Care | Payer: BC Managed Care – PPO | Attending: Student in an Organized Health Care Education/Training Program | Admitting: Student in an Organized Health Care Education/Training Program

## 2021-01-25 DIAGNOSIS — Y92512 Supermarket, store or market as the place of occurrence of the external cause: Secondary | ICD-10-CM | POA: Diagnosis not present

## 2021-01-25 DIAGNOSIS — M79652 Pain in left thigh: Secondary | ICD-10-CM | POA: Insufficient documentation

## 2021-01-25 DIAGNOSIS — Z79899 Other long term (current) drug therapy: Secondary | ICD-10-CM | POA: Insufficient documentation

## 2021-01-25 DIAGNOSIS — I10 Essential (primary) hypertension: Secondary | ICD-10-CM | POA: Diagnosis not present

## 2021-01-25 DIAGNOSIS — W010XXA Fall on same level from slipping, tripping and stumbling without subsequent striking against object, initial encounter: Secondary | ICD-10-CM | POA: Diagnosis not present

## 2021-01-25 DIAGNOSIS — F17210 Nicotine dependence, cigarettes, uncomplicated: Secondary | ICD-10-CM | POA: Insufficient documentation

## 2021-01-25 DIAGNOSIS — R1030 Lower abdominal pain, unspecified: Secondary | ICD-10-CM | POA: Insufficient documentation

## 2021-01-25 DIAGNOSIS — W19XXXA Unspecified fall, initial encounter: Secondary | ICD-10-CM

## 2021-01-25 MED ORDER — MELOXICAM 15 MG PO TABS: 15.0000 mg | ORAL_TABLET | Freq: Every day | ORAL | 2 refills | Status: AC

## 2021-01-25 MED ORDER — ONDANSETRON 4 MG PO TBDP
4.0000 mg | ORAL_TABLET | Freq: Once | ORAL | Status: AC
  Administered 2021-01-25: 4 mg via ORAL
  Filled 2021-01-25: qty 1

## 2021-01-25 MED ORDER — HYDROCODONE-ACETAMINOPHEN 5-325 MG PO TABS
1.0000 | ORAL_TABLET | Freq: Once | ORAL | Status: AC
  Administered 2021-01-25: 1 via ORAL
  Filled 2021-01-25: qty 1

## 2021-01-25 MED ORDER — METHOCARBAMOL 500 MG PO TABS: 500.0000 mg | ORAL_TABLET | Freq: Three times a day (TID) | ORAL | 0 refills | Status: AC | PRN

## 2021-01-25 NOTE — ED Notes (Signed)
Pt. returned from XR. 

## 2021-01-25 NOTE — ED Provider Notes (Signed)
[facility]  ____________________________________________  Time seen: Approximately 9:40 PM  I have reviewed the triage vital signs and the nursing notes.   HISTORY  Chief Complaint Fall (Pt slipped on water at [employer], L leg went under cart in a split motion, c/o L buttock down leg pain, pulses intact, no obvious deformities noted, denies hitting head)   Historian Patient    HPI Brianna Abbott is a 45 y.o. female presents to the emergency department with left thigh pain and left groin pain after patient had a fall at [facility].  Patient states that she slipped with her left leg going under the cart in a split type motion.  Patient states that she has had difficulty ambulating since injury occurred.  No numbness or tingling in the left leg.  No similar injuries in the past.   Past Medical History:  Diagnosis Date   Anxiety    Depression    Hypertension      Immunizations up to date:  Yes.     Past Medical History:  Diagnosis Date   Anxiety    Depression    Hypertension     There are no problems to display for this patient.   Past Surgical History:  Procedure Laterality Date   ABDOMINAL HYSTERECTOMY     OOPHORECTOMY      Prior to Admission medications   Medication Sig Start Date End Date Taking? Authorizing Provider  amLODipine (NORVASC) 10 MG tablet Take 10 mg by mouth daily. 04/21/18   [provider]  ARIPiprazole (ABILIFY) 2 MG tablet Take 2 mg by mouth daily.    [provider]  buPROPion (WELLBUTRIN SR) 150 MG 12 hr tablet Take 150 mg by mouth 2 (two) times daily.    [provider]  busPIRone (BUSPAR) 15 MG tablet Take 15 mg by mouth 2 (two) times daily.    [provider]  hydrochlorothiazide (HYDRODIURIL) 25 MG tablet  07/10/18   [provider]  meloxicam (MOBIC) 15 MG tablet Take 1 tablet (15 mg total) by mouth daily. 10/14/18   Lutricia Feil, PA-C  metaxalone (SKELAXIN) 800 MG tablet  Take 1 tablet (800 mg total) by mouth 3 (three) times daily. 10/14/18   Lutricia Feil, PA-C  TURMERIC PO Take by mouth.    [provider]    Allergies Patient has no known allergies.  Family History  Problem Relation Age of Onset   Healthy Mother    Breast cancer Mother    Hypertension Father     Social History Social History   Tobacco Use   Smoking status: Every Day    Packs/day: 1.00    Pack years: 0.00    Types: Cigarettes   Smokeless tobacco: Never  Vaping Use   Vaping Use: Never used  Substance Use Topics   Alcohol use: Yes    Comment: socially   Drug use: Never     Review of Systems  Constitutional: No fever/chills Eyes:  No discharge ENT: No upper respiratory complaints. Respiratory: no cough. No SOB/ use of accessory muscles to breath Gastrointestinal:   No nausea, no vomiting.  No diarrhea.  No constipation. Musculoskeletal: Patient has left leg pain.  Skin: Negative for rash, abrasions, lacerations, ecchymosis.   ____________________________________________   PHYSICAL EXAM:  VITAL SIGNS: ED Triage Vitals  Enc Vitals Group     BP 01/25/21 1957 123/89     Pulse Rate 01/25/21 1957 83     Resp 01/25/21 1957 16  Temp 01/25/21 1957 98.4 F (36.9 C)     Temp Source 01/25/21 1957 Oral     SpO2 01/25/21 1956 97 %     Weight 01/25/21 1957 250 lb (113.4 kg)     Height 01/25/21 1957 5\' 4"  (1.626 m)     Head Circumference --      Peak Flow --      Pain Score 01/25/21 1958 7     Pain Loc --      Pain Edu? --      Excl. in GC? --      Constitutional: Alert and oriented. Well appearing and in no acute distress. Eyes: Conjunctivae are normal. PERRL. EOMI. Head: Atraumatic. ENT: Cardiovascular: Normal rate, regular rhythm. Normal S1 and S2.  Good peripheral circulation. Respiratory: Normal respiratory effort without tachypnea or retractions. Lungs CTAB. Good air entry to the bases with no decreased or absent breath  sounds Gastrointestinal: Bowel sounds x 4 quadrants. Soft and nontender to palpation. No guarding or rigidity. No distention. Musculoskeletal: Full range of motion to all extremities. No obvious deformities noted.  Palpable dorsalis pedis pulse, left.  Capillary refill less than 2 seconds on the left. Neurologic:  Normal for age. No gross focal neurologic deficits are appreciated.  Skin:  Skin is warm, dry and intact. No rash noted. Psychiatric: Mood and affect are normal for age. Speech and behavior are normal.   ____________________________________________   LABS (all labs ordered are listed, but only abnormal results are displayed)  Labs Reviewed - No data to display ____________________________________________  EKG   ____________________________________________  RADIOLOGY {, 01/27/21, personally viewed and evaluated these images (plain radiographs) as part of my medical decision making, as well as reviewing the written report by the radiologist.  DG Hip Unilat W or Wo Pelvis 2-3 Views Left  Result Date: 01/25/2021 CLINICAL DATA:  Left hip pain. EXAM: DG HIP (WITH OR WITHOUT PELVIS) 2-3V LEFT COMPARISON:  None. FINDINGS: There is no evidence of hip fracture or dislocation. There is no evidence of arthropathy or other focal bone abnormality. IMPRESSION: Negative. Electronically Signed   By: 01/27/2021 M.D.   On: 01/25/2021 21:11    ____________________________________________    PROCEDURES  Procedure(s) performed:     Procedures     Medications  HYDROcodone-acetaminophen (NORCO/VICODIN) 5-325 MG per tablet 1 tablet (1 tablet Oral Given 01/25/21 2110)  ondansetron (ZOFRAN-ODT) disintegrating tablet 4 mg (4 mg Oral Given 01/25/21 2110)     ____________________________________________   INITIAL IMPRESSION / ASSESSMENT AND PLAN / ED COURSE  Pertinent labs & imaging results that were available during my care of the patient were reviewed by me and  considered in my medical decision making (see chart for details).      Assessment and plan Left leg pain 45 year old female presents to the emergency department with left thigh pain and left groin pain after mechanical fall.  No bony abnormality was visualized on x-ray of the left hip.  Patient was discharged with meloxicam for pain and inflammation.  She was advised to follow-up with primary care as needed.     ____________________________________________  FINAL CLINICAL IMPRESSION(S) / ED DIAGNOSES  Final diagnoses:  Fall, initial encounter      NEW MEDICATIONS STARTED DURING THIS VISIT:  ED Discharge Orders     None           This chart was dictated using voice recognition software/Dragon. Despite best efforts to proofread, errors can occur which can change the meaning.  Any change was purely unintentional.     Gasper Lloyd 01/25/21 2143    Willy Eddy, MD 01/26/21 [phone]

## 2022-08-25 ENCOUNTER — Other Ambulatory Visit: Payer: Self-pay | Admitting: Physician Assistant

## 2022-08-25 DIAGNOSIS — H02401 Unspecified ptosis of right eyelid: Secondary | ICD-10-CM

## 2022-08-25 DIAGNOSIS — G8929 Other chronic pain: Secondary | ICD-10-CM

## 2022-08-25 DIAGNOSIS — H532 Diplopia: Secondary | ICD-10-CM

## 2022-11-24 ENCOUNTER — Other Ambulatory Visit: Payer: Self-pay | Admitting: Nurse Practitioner

## 2022-11-24 DIAGNOSIS — Z1231 Encounter for screening mammogram for malignant neoplasm of breast: Secondary | ICD-10-CM

## 2022-12-05 ENCOUNTER — Ambulatory Visit: Payer: BC Managed Care – PPO

## 2022-12-05 DIAGNOSIS — Z83719 Family history of colon polyps, unspecified: Secondary | ICD-10-CM | POA: Diagnosis not present

## 2022-12-05 DIAGNOSIS — Z1211 Encounter for screening for malignant neoplasm of colon: Secondary | ICD-10-CM

## 2023-01-30 ENCOUNTER — Ambulatory Visit
Admission: RE | Admit: 2023-01-30 | Discharge: 2023-01-30 | Disposition: A | Discharge status: Home / Self Care | Payer: BC Managed Care – PPO | Source: Ambulatory Visit | Attending: Nurse Practitioner | Admitting: Nurse Practitioner

## 2023-01-30 DIAGNOSIS — Z1231 Encounter for screening mammogram for malignant neoplasm of breast: Secondary | ICD-10-CM

## 2023-02-05 ENCOUNTER — Ambulatory Visit
Admission: RE | Admit: 2023-02-05 | Discharge: 2023-02-05 | Disposition: A | Discharge status: Home / Self Care | Payer: BC Managed Care – PPO | Source: Ambulatory Visit | Attending: Family Medicine | Admitting: Family Medicine

## 2023-02-05 ENCOUNTER — Other Ambulatory Visit: Payer: Self-pay | Admitting: Family Medicine

## 2023-02-05 DIAGNOSIS — M79662 Pain in left lower leg: Secondary | ICD-10-CM | POA: Diagnosis present

## 2023-02-05 DIAGNOSIS — M25562 Pain in left knee: Secondary | ICD-10-CM

## 2023-03-24 ENCOUNTER — Other Ambulatory Visit: Payer: Self-pay | Admitting: Orthopedic Surgery

## 2023-03-24 DIAGNOSIS — M2392 Unspecified internal derangement of left knee: Secondary | ICD-10-CM

## 2023-03-24 DIAGNOSIS — G8929 Other chronic pain: Secondary | ICD-10-CM

## 2023-03-24 DIAGNOSIS — M25462 Effusion, left knee: Secondary | ICD-10-CM

## 2023-03-30 ENCOUNTER — Encounter: Payer: Self-pay | Admitting: Orthopedic Surgery

## 2023-04-02 ENCOUNTER — Ambulatory Visit
Admission: RE | Admit: 2023-04-02 | Discharge: 2023-04-02 | Disposition: A | Discharge status: Home / Self Care | Payer: BC Managed Care – PPO | Source: Ambulatory Visit | Attending: Orthopedic Surgery | Admitting: Orthopedic Surgery

## 2023-04-02 DIAGNOSIS — M2392 Unspecified internal derangement of left knee: Secondary | ICD-10-CM

## 2023-04-02 DIAGNOSIS — M25462 Effusion, left knee: Secondary | ICD-10-CM

## 2023-04-02 DIAGNOSIS — G8929 Other chronic pain: Secondary | ICD-10-CM

## 2023-04-22 ENCOUNTER — Other Ambulatory Visit: Payer: Self-pay | Admitting: Orthopedic Surgery

## 2023-04-22 DIAGNOSIS — G8929 Other chronic pain: Secondary | ICD-10-CM

## 2023-07-08 ENCOUNTER — Other Ambulatory Visit: Payer: Self-pay | Admitting: Family Medicine

## 2023-07-08 DIAGNOSIS — S2001XA Contusion of right breast, initial encounter: Secondary | ICD-10-CM

## 2023-07-14 ENCOUNTER — Ambulatory Visit
Admission: RE | Admit: 2023-07-14 | Discharge: 2023-07-14 | Disposition: A | Discharge status: Home / Self Care | Payer: BC Managed Care – PPO | Source: Ambulatory Visit | Attending: Family Medicine | Admitting: Family Medicine

## 2023-07-14 DIAGNOSIS — S2001XA Contusion of right breast, initial encounter: Secondary | ICD-10-CM | POA: Insufficient documentation

## 2023-08-06 ENCOUNTER — Other Ambulatory Visit: Payer: Self-pay | Admitting: Student

## 2023-08-06 DIAGNOSIS — M545 Low back pain, unspecified: Secondary | ICD-10-CM

## 2023-08-08 ENCOUNTER — Ambulatory Visit
Admission: RE | Admit: 2023-08-08 | Discharge: 2023-08-08 | Disposition: A | Discharge status: Home / Self Care | Payer: BC Managed Care – PPO | Source: Ambulatory Visit | Attending: Student | Admitting: Student

## 2023-08-08 DIAGNOSIS — M545 Low back pain, unspecified: Secondary | ICD-10-CM | POA: Insufficient documentation

## 2023-11-11 ENCOUNTER — Encounter: Payer: Self-pay | Admitting: Emergency Medicine

## 2023-11-11 ENCOUNTER — Ambulatory Visit
Admission: EM | Admit: 2023-11-11 | Discharge: 2023-11-11 | Disposition: A | Discharge status: Home / Self Care | Attending: Physician Assistant | Admitting: Physician Assistant

## 2023-11-11 DIAGNOSIS — G8929 Other chronic pain: Secondary | ICD-10-CM | POA: Diagnosis not present

## 2023-11-11 DIAGNOSIS — M25562 Pain in left knee: Secondary | ICD-10-CM

## 2023-11-11 MED ORDER — KETOROLAC TROMETHAMINE 60 MG/2ML IM SOLN
30.0000 mg | Freq: Once | INTRAMUSCULAR | Status: AC
  Administered 2023-11-11: 30 mg via INTRAMUSCULAR

## 2023-11-11 MED ORDER — HYDROCODONE-ACETAMINOPHEN 5-325 MG PO TABS: 1.0000 | ORAL_TABLET | Freq: Four times a day (QID) | ORAL | 0 refills | Status: AC | PRN

## 2023-11-11 NOTE — ED Provider Notes (Signed)
 [facility]    CSN: [id number] Arrival date & time: 11/11/23  1126      History   Chief Complaint Chief Complaint  Patient presents with   Knee Pain    HPI Brianna Abbott is a 48 y.o. female presenting for 2 day flare up of chronic intermittent left knee pain since last summer. Brianna Abbott does follow up with orthopedics and the last follow up was last month. History of meniscus tear. Brianna Abbott was given an injection into the knee at that time. Brianna Abbott is currently in PT and uses a knee brace,  but not using brace today. Report a crunching sound in knee, which is new since the pain worsened. No new injuries. Knee feels week.   HPI  Past Medical History:  Diagnosis Date   Anxiety    Depression    Hypertension     There are no active problems to display for this patient.   Past Surgical History:  Procedure Laterality Date   ABDOMINAL HYSTERECTOMY     OOPHORECTOMY      OB History   No obstetric history on file.      Home Medications    Prior to Admission medications   Medication Sig Start Date End Date Taking? Authorizing Provider  HYDROcodone-acetaminophen (NORCO/VICODIN) 5-325 MG tablet Take 1 tablet by mouth every 6 (six) hours as needed for up to 3 days for moderate pain (pain score 4-6) or severe pain (pain score 7-10). 11/11/23 11/14/23 Yes Eusebio Friendly B, PA-C  amLODipine (NORVASC) 10 MG tablet Take 10 mg by mouth daily. 04/21/18   [provider]  ARIPiprazole (ABILIFY) 2 MG tablet Take 2 mg by mouth daily.    [provider]  buPROPion (WELLBUTRIN SR) 150 MG 12 hr tablet Take 150 mg by mouth 2 (two) times daily.    [provider]  busPIRone (BUSPAR) 15 MG tablet Take 15 mg by mouth 2 (two) times daily.    [provider]  hydrochlorothiazide (HYDRODIURIL) 25 MG tablet  07/10/18   [provider]  metaxalone (SKELAXIN) 800 MG tablet Take 1 tablet (800 mg total) by mouth 3 (three) times daily. 10/14/18   Lutricia Feil,  PA-C  TURMERIC PO Take by mouth.    [provider]    Family History Family History  Problem Relation Age of Onset   Healthy Mother    Hypertension Father    Breast cancer Neg Hx     Social History Social History   Tobacco Use   Smoking status: Every Day    Current packs/day: 1.00    Types: Cigarettes   Smokeless tobacco: Never  Vaping Use   Vaping status: Never Used  Substance Use Topics   Alcohol use: Yes    Comment: socially   Drug use: Never     Allergies   Patient has no known allergies.   Review of Systems Review of Systems  Musculoskeletal:  Positive for arthralgias and joint swelling.  Skin:  Negative for color change.  Neurological:  Positive for weakness (left knee). Negative for numbness.     Physical Exam Triage Vital Signs ED Triage Vitals  Encounter Vitals Group     BP      Systolic BP Percentile      Diastolic BP Percentile      Pulse      Resp      Temp      Temp src      SpO2  Weight      Height      Head Circumference      Peak Flow      Pain Score      Pain Loc      Pain Education      Exclude from Growth Chart    No data found.  Updated Vital Signs BP 114/82 (BP Location: Left Arm)   Pulse 82   Temp 97.9 F (36.6 C) (Oral)   Resp 16   SpO2 98%     Physical Exam Vitals and nursing note reviewed.  Constitutional:      General: Brianna Abbott is not in acute distress.    Appearance: Normal appearance. Brianna Abbott is not ill-appearing or toxic-appearing.  HENT:     Head: Normocephalic and atraumatic.  Eyes:     General: No scleral icterus.       Right eye: No discharge.        Left eye: No discharge.     Conjunctiva/sclera: Conjunctivae normal.  Cardiovascular:     Rate and Rhythm: Normal rate and regular rhythm.     Pulses: Normal pulses.  Pulmonary:     Effort: Pulmonary effort is normal. No respiratory distress.  Musculoskeletal:     Cervical back: Neck supple.     Left knee: Swelling (mild) and crepitus  present. Normal range of motion. Tenderness present over the lateral joint line.  Skin:    General: Skin is dry.  Neurological:     General: No focal deficit present.     Mental Status: Brianna Abbott is alert. Mental status is at baseline.     Motor: No weakness.     Gait: Gait normal.  Psychiatric:        Mood and Affect: Mood normal.        Behavior: Behavior normal.      UC Treatments / Results  Labs (all labs ordered are listed, but only abnormal results are displayed) Labs Reviewed - No data to display  EKG   Radiology No results found.  Knee MRI (Left): 04/02/23: FINDINGS:  Despite efforts by the technologist and patient, mild motion  artifact is present on today's exam and could not be eliminated.  This reduces exam sensitivity and specificity.   MENISCI   Medial meniscus: Irregular radial tear of the posterior horn is best  demonstrated on the coronal and sagittal images. The minimal discus  is partially extruded peripherally from the joint. The meniscal root  is intact. No centrally displaced meniscal fragments are identified.   Lateral meniscus: Intact with normal morphology.   LIGAMENTS   Cruciates: The anterior and posterior cruciate ligaments are intact.  Mild ACL mucoid degeneration.   Collaterals: The medial and lateral collateral ligament complexes  are intact. Medial buckling of the MCL related to the medial  meniscal extrusion with associated mild pes anserine bursal fluid.   CARTILAGE   Patellofemoral: Preserved.   Medial: Moderate chondral thinning and surface irregularity. No  focal chondral defect identified. There is asymmetric marrow edema  within the medial tibial plateau with a possible small subchondral  insufficiency fracture.   Lateral: Preserved.   MISCELLANEOUS   Joint: Moderate-sized knee joint effusion.   Popliteal Fossa: The popliteus muscle and tendon are intact.  Moderate-sized Baker's cyst measuring up to 5.9 cm in length.    Extensor Mechanism: The quadriceps and patellar tendons are intact.  The patellar retinacula are intact. There is a thickened medial  patellar plica.   Bones: As above, possible  subchondral insufficiency fracture within  the medial tibial plateau with associated surrounding asymmetric  marrow edema. No other acute osseous findings.   Other: Nonspecific subcutaneous edema surrounding the knee, greatest  anteriorly. No other focal fluid collections.   IMPRESSION:  1. Irregular radial tear of the posterior horn of the medial  meniscus.  2. Moderate medial compartment degenerative chondrosis with possible  small subchondral insufficiency fracture in the medial tibial  plateau.  3. Moderate-sized knee joint effusion and moderate-sized Baker's  cyst.  4. The lateral meniscus, cruciate and collateral ligaments are  intact.   Procedures Procedures (including critical care time)  Medications Ordered in UC Medications  ketorolac (TORADOL) injection 30 mg (30 mg Intramuscular Given 11/11/23 1200)    Initial Impression / Assessment and Plan / UC Course  I have reviewed the triage vital signs and the nursing notes.  Pertinent labs & imaging results that were available during my care of the patient were reviewed by me and considered in my medical decision making (see chart for details).   48 year old female presents for 2-day flareup of left knee pain.  Has had chronic intermittent pain of left knee due to meniscus tear for the past year.  Follows with orthopedics.  Saw them last month and was given injection into the knee.  Reports new crepitus and worsening pain.  On evaluation today Brianna Abbott has mild left knee swelling.  Full range of motion.  Tenderness of the lateral knee.  Normal gait.  Reviewed office visit notes from orthopedics and imaging results.  Included MRI findings from last year which show medial meniscus tear, medial compartment degenerative chondrosis.  "1. We discussed  different treatment options including both non-operative and surgical pathways. We discussed that surgical options could include arthroscopy plus subchondroplasty versus arthroplasty vs HTO. We discussed that neither of these options were ideal and each had their downsides. Given this, we agreed to proceed with further conservative management with continued home exercises, medial unloader brace wear, and trial of viscosupplementation injections as corticosteroid injections have not been beneficial. See procedure note below for further details. 2. Follow-up with me in 2 months as needed. "--Signa Kell, MD 10/23/2023  Patient was given 30 mg IM ketorolac in clinic for acute pain relief.  Advised to continue naproxen at home.  Sent short supply of Norco for emergency purposes only.  Encouraged her to follow-up with orthopedics ASAP.  Until then Brianna Abbott should be wearing knee brace and avoiding painful activities.  Work note was given.   Final Clinical Impressions(s) / UC Diagnoses   Final diagnoses:  Chronic pain of left knee     Discharge Instructions      -Wear your brace - We gave you an injection of an anti-inflammatory medicine in the clinic.  You may take naproxen at home. - Try to contact orthopedics for follow-up as soon as possible.     ED Prescriptions     Medication Sig Dispense Auth. Provider   HYDROcodone-acetaminophen (NORCO/VICODIN) 5-325 MG tablet Take 1 tablet by mouth every 6 (six) hours as needed for up to 3 days for moderate pain (pain score 4-6) or severe pain (pain score 7-10). 10 tablet Shirlee Latch, PA-C      I have reviewed the PDMP during this encounter.   Shirlee Latch, PA-C 11/11/23 1224

## 2023-11-11 NOTE — ED Triage Notes (Signed)
 Pt presents with left knee pain started yesterday. Pt states she has a torn meniscus in her left knee.

## 2023-11-11 NOTE — Discharge Instructions (Addendum)
-  Wear your brace - We gave you an injection of an anti-inflammatory medicine in the clinic.  You may take naproxen at home. - Try to contact orthopedics for follow-up as soon as possible.

## 2023-11-18 ENCOUNTER — Other Ambulatory Visit: Payer: Self-pay | Admitting: Orthopedic Surgery

## 2023-11-18 DIAGNOSIS — M1711 Unilateral primary osteoarthritis, right knee: Secondary | ICD-10-CM

## 2023-11-18 DIAGNOSIS — S83222A Peripheral tear of medial meniscus, current injury, left knee, initial encounter: Secondary | ICD-10-CM

## 2023-11-25 ENCOUNTER — Ambulatory Visit
Admission: RE | Admit: 2023-11-25 | Discharge: 2023-11-25 | Disposition: A | Discharge status: Home / Self Care | Source: Ambulatory Visit | Attending: Orthopedic Surgery | Admitting: Orthopedic Surgery

## 2023-11-25 DIAGNOSIS — S83222A Peripheral tear of medial meniscus, current injury, left knee, initial encounter: Secondary | ICD-10-CM

## 2023-11-25 DIAGNOSIS — M1711 Unilateral primary osteoarthritis, right knee: Secondary | ICD-10-CM

## 2023-12-01 ENCOUNTER — Other Ambulatory Visit: Payer: Self-pay | Admitting: Nurse Practitioner

## 2023-12-01 DIAGNOSIS — Z1231 Encounter for screening mammogram for malignant neoplasm of breast: Secondary | ICD-10-CM

## 2024-01-19 ENCOUNTER — Other Ambulatory Visit: Payer: Self-pay | Admitting: Orthopedic Surgery

## 2024-01-22 ENCOUNTER — Other Ambulatory Visit: Payer: Self-pay | Admitting: Orthopedic Surgery

## 2024-01-28 ENCOUNTER — Other Ambulatory Visit

## 2024-02-03 ENCOUNTER — Ambulatory Visit

## 2024-02-05 ENCOUNTER — Inpatient Hospital Stay: Admission: RE | Admit: 2024-02-05 | Source: Ambulatory Visit

## 2024-02-08 ENCOUNTER — Encounter
Admission: RE | Admit: 2024-02-08 | Discharge: 2024-02-08 | Disposition: A | Discharge status: Home / Self Care | Source: Ambulatory Visit | Attending: Orthopedic Surgery | Admitting: Orthopedic Surgery

## 2024-02-08 ENCOUNTER — Other Ambulatory Visit: Payer: Self-pay

## 2024-02-08 VITALS — BP 119/86 | HR 81 | Temp 98.0°F | Resp 16 | Ht 64.0 in | Wt 212.6 lb

## 2024-02-08 DIAGNOSIS — Z01818 Encounter for other preprocedural examination: Secondary | ICD-10-CM | POA: Insufficient documentation

## 2024-02-08 HISTORY — DX: Other specified postprocedural states: R11.2

## 2024-02-08 LAB — URINALYSIS, ROUTINE W REFLEX MICROSCOPIC
Bilirubin Urine: NEGATIVE
Glucose, UA: NEGATIVE mg/dL
Hgb urine dipstick: NEGATIVE
Ketones, ur: NEGATIVE mg/dL
Leukocytes,Ua: NEGATIVE
Nitrite: NEGATIVE
Protein, ur: NEGATIVE mg/dL
Specific Gravity, Urine: 1.009 (ref 1.005–1.030)
pH: 7 (ref 5.0–8.0)

## 2024-02-08 LAB — CBC WITH DIFFERENTIAL/PLATELET
Abs Immature Granulocytes: 0.01 10*3/uL (ref 0.00–0.07)
Basophils Absolute: 0 10*3/uL (ref 0.0–0.1)
Basophils Relative: 1 %
Eosinophils Absolute: 0.1 10*3/uL (ref 0.0–0.5)
Eosinophils Relative: 1 %
HCT: 40.8 % (ref 36.0–46.0)
Hemoglobin: 13.6 g/dL (ref 12.0–15.0)
Immature Granulocytes: 0 %
Lymphocytes Relative: 50 %
Lymphs Abs: 2.4 10*3/uL (ref 0.7–4.0)
MCH: 29.9 pg (ref 26.0–34.0)
MCHC: 33.3 g/dL (ref 30.0–36.0)
MCV: 89.7 fL (ref 80.0–100.0)
Monocytes Absolute: 0.4 10*3/uL (ref 0.1–1.0)
Monocytes Relative: 8 %
Neutro Abs: 1.9 10*3/uL (ref 1.7–7.7)
Neutrophils Relative %: 40 %
Platelets: 309 10*3/uL (ref 150–400)
RBC: 4.55 MIL/uL (ref 3.87–5.11)
RDW: 13.2 % (ref 11.5–15.5)
WBC: 4.8 10*3/uL (ref 4.0–10.5)
nRBC: 0 % (ref 0.0–0.2)

## 2024-02-08 LAB — COMPREHENSIVE METABOLIC PANEL WITH GFR
ALT: 24 U/L (ref 0–44)
AST: 28 U/L (ref 15–41)
Albumin: 4.3 g/dL (ref 3.5–5.0)
Alkaline Phosphatase: 68 U/L (ref 38–126)
Anion gap: 13 (ref 5–15)
BUN: 16 mg/dL (ref 6–20)
CO2: 27 mmol/L (ref 22–32)
Calcium: 9.7 mg/dL (ref 8.9–10.3)
Chloride: 102 mmol/L (ref 98–111)
Creatinine, Ser: 0.67 mg/dL (ref 0.44–1.00)
GFR, Estimated: >60 mL/min (ref >60)
Glucose, Bld: 97 mg/dL (ref 70–99)
Potassium: 3.5 mmol/L (ref 3.5–5.1)
Sodium: 142 mmol/L (ref 135–145)
Total Bilirubin: 0.6 mg/dL (ref 0.0–1.2)
Total Protein: 7.6 g/dL (ref 6.5–8.1)

## 2024-02-08 LAB — SURGICAL PCR SCREEN
MRSA, PCR: NEGATIVE
Staphylococcus aureus: NEGATIVE

## 2024-02-08 NOTE — Patient Instructions (Addendum)
 Your procedure is scheduled on: Thursday 02/18/24 Report to the Registration Desk on the 1st floor of the [facility]. To find out your arrival time, please call [phone] between 1PM - 3PM on: Wednesday 02/17/24 If your arrival time is 6:00 am, do not arrive before that time as the [facility] entrance doors do not open until 6:00 am.  REMEMBER: Instructions that are not followed completely may result in serious medical risk, up to and including death; or upon the discretion of your surgeon and anesthesiologist your surgery may need to be rescheduled.  Do not eat food after midnight the night before surgery.  No gum chewing or hard candies.  You may however, drink CLEAR liquids up to 2 hours before you are scheduled to arrive for your surgery. Do not drink anything within 2 hours of your scheduled arrival time.  Clear liquids include: - water  - apple juice without pulp - gatorade (not RED colors) - black coffee or tea (Do NOT add milk or creamers to the coffee or tea) Do NOT drink anything that is not on this list.  In addition, your doctor has ordered for you to drink the provided:  Ensure Pre-Surgery Clear Carbohydrate Drink  Drinking this carbohydrate drink up to two hours before surgery helps to reduce insulin resistance and improve patient outcomes. Please complete drinking 2 hours before scheduled arrival time.  One week prior to surgery: Stop Anti-inflammatories (NSAIDS) such as Advil, Aleve, Ibuprofen, Motrin, Naproxen, Naprosyn and Aspirin based products such as Excedrin, Goody's Powder, BC Powder. Stop ANY OVER THE COUNTER supplements until after surgery.  You may however, continue to take Tylenol  if needed for pain up until the day of surgery.   Continue taking all of your other prescription medications up until the day of surgery.  ON THE DAY OF SURGERY ONLY TAKE THESE MEDICATIONS WITH SIPS OF WATER:  amLODipine (NORVASC) * buPROPion (WELLBUTRIN XL)  gabapentin  (NEURONTIN) * DULoxetine (CYMBALTA)  omeprazole (PRILOSEC OTC) * busPIRone (BUSPAR)  Use inhalers on the day of surgery and bring to the hospital.  Fleets enema or bowel prep as directed.  No Alcohol for 24 hours before or after surgery.  No Smoking including e-cigarettes for 24 hours before surgery.  No chewable tobacco products for at least 6 hours before surgery.  No nicotine patches on the day of surgery.  Do not use any recreational drugs for at least a week (preferably 2 weeks) before your surgery.  Please be advised that the combination of cocaine and anesthesia may have negative outcomes, up to and including death. If you test positive for cocaine, your surgery will be cancelled.  On the morning of surgery brush your teeth with toothpaste and water, you may rinse your mouth with mouthwash if you wish. Do not swallow any toothpaste or mouthwash.  Use CHG Soap or wipes as directed on instruction sheet.  Do not wear jewelry, make-up, hairpins, clips or nail polish.  For welded (permanent) jewelry: bracelets, anklets, waist bands, etc.  Please have this removed prior to surgery.  If it is not removed, there is a chance that hospital personnel will need to cut it off on the day of surgery.  Do not wear lotions, powders, or perfumes.   Do not shave body hair from the neck down 48 hours before surgery.  Contact lenses, hearing aids and dentures may not be worn into surgery.  Do not bring valuables to the hospital. [facility] is not responsible for any  missing/lost belongings or valuables.   Bring your C-PAP to the hospital in case you may have to spend the night.   Notify your doctor if there is any change in your medical condition (cold, fever, infection).  Wear comfortable clothing (specific to your surgery type) to the hospital.  After surgery, you can help prevent lung complications by doing breathing exercises.  Take deep breaths and cough every 1-2 hours. Your  doctor may order a device called an Incentive Spirometer to help you take deep breaths. When coughing or sneezing, hold a pillow firmly against your incision with both hands. This is called "splinting." Doing this helps protect your incision. It also decreases belly discomfort.  If you are being admitted to the hospital overnight, leave your suitcase in the car. After surgery it may be brought to your room.  In case of increased patient census, it may be necessary for you, the patient, to continue your postoperative care in the Same Day Surgery department.  If you are being discharged the day of surgery, you will not be allowed to drive home. You will need a responsible individual to drive you home and stay with you for 24 hours after surgery.   If you are taking public transportation, you will need to have a responsible individual with you.  Please call the Pre-admissions Testing Dept. at [phone] if you have any questions about these instructions.  Surgery Visitation Policy:  Patients having surgery or a procedure may have two visitors.  Children under the age of 41 must have an adult with them who is not the patient.  Inpatient Visitation:    Visiting hours are 7 a.m. to 8 p.m. Up to four visitors are allowed at one time in a patient room. The visitors may rotate out with other people during the day.  One visitor age 24 or older may stay with the patient overnight and must be in the room by 8 p.m.  Merchandiser, retail to address health-related social needs:  [email or web address]    Pre-operative 5 CHG Bath Instructions   You can play a key role in reducing the risk of infection after surgery. Your skin needs to be as free of germs as possible. You can reduce the number of germs on your skin by washing with CHG (chlorhexidine gluconate) soap before surgery. CHG is an antiseptic soap that kills germs and continues to kill germs even after washing.   DO NOT  use if you have an allergy to chlorhexidine/CHG or antibacterial soaps. If your skin becomes reddened or irritated, stop using the CHG and notify one of our RNs at [phone].   Please shower with the CHG soap starting 4 days before surgery using the following schedule:     Please keep in mind the following:  DO NOT shave, including legs and underarms, starting the day of your first shower.   You may shave your face at any point before/day of surgery.  Place clean sheets on your bed the day you start using CHG soap. Use a clean washcloth (not used since being washed) for each shower. DO NOT sleep with pets once you start using the CHG.   CHG Shower Instructions:  If you choose to wash your hair and private area, wash first with your normal shampoo/soap.  After you use shampoo/soap, rinse your hair and body thoroughly to remove shampoo/soap residue.  Turn the water OFF and apply about 3 tablespoons (45 ml) of CHG soap to a CLEAN washcloth.  Apply CHG soap ONLY FROM YOUR NECK DOWN TO YOUR TOES (washing for 3-5 minutes)  DO NOT use CHG soap on face, private areas, open wounds, or sores.  Pay special attention to the area where your surgery is being performed.  If you are having back surgery, having someone wash your back for you may be helpful. Wait 2 minutes after CHG soap is applied, then you may rinse off the CHG soap.  Pat dry with a clean towel  Put on clean clothes/pajamas   If you choose to wear lotion, please use ONLY the CHG-compatible lotions on the back of this paper.     Additional instructions for the day of surgery: DO NOT APPLY any lotions, deodorants, cologne, or perfumes.   Put on clean/comfortable clothes.  Brush your teeth.  Ask your nurse before applying any prescription medications to the skin.      CHG Compatible Lotions   Aveeno Moisturizing lotion  Cetaphil Moisturizing Cream  Cetaphil Moisturizing Lotion  Clairol Herbal Essence Moisturizing Lotion, Dry  Skin  Clairol Herbal Essence Moisturizing Lotion, Extra Dry Skin  Clairol Herbal Essence Moisturizing Lotion, Normal Skin  Curel Age Defying Therapeutic Moisturizing Lotion with Alpha Hydroxy  Curel Extreme Care Body Lotion  Curel Soothing Hands Moisturizing Hand Lotion  Curel Therapeutic Moisturizing Cream, Fragrance-Free  Curel Therapeutic Moisturizing Lotion, Fragrance-Free  Curel Therapeutic Moisturizing Lotion, Original Formula  Eucerin Daily Replenishing Lotion  Eucerin Dry Skin Therapy Plus Alpha Hydroxy Crme  Eucerin Dry Skin Therapy Plus Alpha Hydroxy Lotion  Eucerin Original Crme  Eucerin Original Lotion  Eucerin Plus Crme Eucerin Plus Lotion  Eucerin TriLipid Replenishing Lotion  Keri Anti-Bacterial Hand Lotion  Keri Deep Conditioning Original Lotion Dry Skin Formula Softly Scented  Keri Deep Conditioning Original Lotion, Fragrance Free Sensitive Skin Formula  Keri Lotion Fast Absorbing Fragrance Free Sensitive Skin Formula  Keri Lotion Fast Absorbing Softly Scented Dry Skin Formula  Keri Original Lotion  Keri Skin Renewal Lotion Keri Silky Smooth Lotion  Keri Silky Smooth Sensitive Skin Lotion  Nivea Body Creamy Conditioning Oil  Nivea Body Extra Enriched Lotion  Nivea Body Original Lotion  Nivea Body Sheer Moisturizing Lotion Nivea Crme  Nivea Skin Firming Lotion  NutraDerm 30 Skin Lotion  NutraDerm Skin Lotion  NutraDerm Therapeutic Skin Cream  NutraDerm Therapeutic Skin Lotion  ProShield Protective Hand Cream  Provon moisturizing lotion    Preoperative Educational Videos for Total Hip, Knee and Shoulder Replacements  To better prepare for surgery, please view our videos that explain the physical activity and discharge planning required to have the best surgical recovery at [facility].  [email or web address]  Questions? Call [phone] or email  [email or web address]

## 2024-02-09 LAB — NICOTINE SCREEN, URINE: Cotinine Ql Scrn, Ur: NEGATIVE ng/mL

## 2024-02-18 ENCOUNTER — Ambulatory Visit
Admission: RE | Admit: 2024-02-18 | Discharge: 2024-02-18 | Disposition: A | Discharge status: Home / Self Care | Attending: Orthopedic Surgery | Admitting: Orthopedic Surgery

## 2024-02-18 ENCOUNTER — Other Ambulatory Visit: Payer: Self-pay

## 2024-02-18 ENCOUNTER — Encounter
Admission: RE | Disposition: A | Discharge status: Home / Self Care | Payer: Self-pay | Source: Home / Self Care | Attending: Orthopedic Surgery

## 2024-02-18 ENCOUNTER — Ambulatory Visit: Payer: Self-pay | Admitting: Urgent Care

## 2024-02-18 ENCOUNTER — Ambulatory Visit

## 2024-02-18 ENCOUNTER — Encounter: Payer: Self-pay | Admitting: Orthopedic Surgery

## 2024-02-18 DIAGNOSIS — S83242A Other tear of medial meniscus, current injury, left knee, initial encounter: Secondary | ICD-10-CM | POA: Insufficient documentation

## 2024-02-18 DIAGNOSIS — Z87891 Personal history of nicotine dependence: Secondary | ICD-10-CM | POA: Insufficient documentation

## 2024-02-18 DIAGNOSIS — X58XXXA Exposure to other specified factors, initial encounter: Secondary | ICD-10-CM | POA: Diagnosis not present

## 2024-02-18 DIAGNOSIS — F419 Anxiety disorder, unspecified: Secondary | ICD-10-CM | POA: Insufficient documentation

## 2024-02-18 DIAGNOSIS — F32A Depression, unspecified: Secondary | ICD-10-CM | POA: Insufficient documentation

## 2024-02-18 DIAGNOSIS — M1712 Unilateral primary osteoarthritis, left knee: Secondary | ICD-10-CM | POA: Insufficient documentation

## 2024-02-18 DIAGNOSIS — S83222A Peripheral tear of medial meniscus, current injury, left knee, initial encounter: Secondary | ICD-10-CM | POA: Diagnosis present

## 2024-02-18 DIAGNOSIS — M25762 Osteophyte, left knee: Secondary | ICD-10-CM | POA: Insufficient documentation

## 2024-02-18 DIAGNOSIS — I10 Essential (primary) hypertension: Secondary | ICD-10-CM | POA: Insufficient documentation

## 2024-02-18 DIAGNOSIS — Z8249 Family history of ischemic heart disease and other diseases of the circulatory system: Secondary | ICD-10-CM | POA: Diagnosis not present

## 2024-02-18 HISTORY — PX: PARTIAL KNEE ARTHROPLASTY: SHX2174

## 2024-02-18 SURGERY — ARTHROPLASTY, KNEE, UNICOMPARTMENTAL
Anesthesia: Spinal | Site: Knee | Laterality: Left

## 2024-02-18 MED ORDER — SENNA-DOCUSATE SODIUM 8.6-50 MG PO TABS: 2.0000 | ORAL_TABLET | Freq: Every day | ORAL | 0 refills | Status: AC

## 2024-02-18 MED ORDER — SODIUM CHLORIDE (PF) 0.9 % IJ SOLN
INTRAMUSCULAR | Status: DC | PRN
  Administered 2024-02-18: 71 mL via INTRAMUSCULAR

## 2024-02-18 MED ORDER — CHLORHEXIDINE GLUCONATE 0.12 % MT SOLN: 15.0000 mL | Freq: Once | OROMUCOSAL | Status: DC

## 2024-02-18 MED ORDER — SURGIPHOR WOUND IRRIGATION SYSTEM - OPTIME
TOPICAL | Status: DC | PRN
  Administered 2024-02-18: 450 mL

## 2024-02-18 MED ORDER — ACETAMINOPHEN 325 MG PO TABS: 325.0000 mg | ORAL_TABLET | Freq: Four times a day (QID) | ORAL | Status: DC | PRN

## 2024-02-18 MED ORDER — OXYCODONE HCL 5 MG/5ML PO SOLN: 5.0000 mg | Freq: Once | ORAL | Status: AC | PRN

## 2024-02-18 MED ORDER — OXYCODONE HCL 5 MG PO TABS
5.0000 mg | ORAL_TABLET | Freq: Once | ORAL | Status: AC | PRN
  Administered 2024-02-18: 5 mg via ORAL

## 2024-02-18 MED ORDER — ONDANSETRON HCL 4 MG PO TABS: 4.0000 mg | ORAL_TABLET | Freq: Three times a day (TID) | ORAL | 0 refills | Status: AC | PRN

## 2024-02-18 MED ORDER — ORAL CARE MOUTH RINSE: 15.0000 mL | Freq: Once | OROMUCOSAL | Status: DC

## 2024-02-18 MED ORDER — FENTANYL CITRATE (PF) 100 MCG/2ML IJ SOLN
INTRAMUSCULAR | Status: AC
  Filled 2024-02-18: qty 2

## 2024-02-18 MED ORDER — ONDANSETRON HCL 4 MG/2ML IJ SOLN: 4.0000 mg | Freq: Four times a day (QID) | INTRAMUSCULAR | Status: DC | PRN

## 2024-02-18 MED ORDER — CHLORHEXIDINE GLUCONATE 0.12 % MT SOLN
OROMUCOSAL | Status: AC
Start: 2024-02-18 — End: 2024-02-18
  Filled 2024-02-18: qty 15

## 2024-02-18 MED ORDER — MIDAZOLAM HCL 5 MG/5ML IJ SOLN
INTRAMUSCULAR | Status: DC | PRN
  Administered 2024-02-18: 2 mg via INTRAVENOUS

## 2024-02-18 MED ORDER — CELECOXIB 200 MG PO CAPS: 200.0000 mg | ORAL_CAPSULE | Freq: Two times a day (BID) | ORAL | 0 refills | Status: AC

## 2024-02-18 MED ORDER — PROPOFOL 500 MG/50ML IV EMUL
INTRAVENOUS | Status: DC | PRN
  Administered 2024-02-18: 125 ug/kg/min via INTRAVENOUS

## 2024-02-18 MED ORDER — TRANEXAMIC ACID-NACL 1000-0.7 MG/100ML-% IV SOLN
1000.0000 mg | INTRAVENOUS | Status: AC
  Administered 2024-02-18 (×2): 1000 mg via INTRAVENOUS

## 2024-02-18 MED ORDER — CEFAZOLIN SODIUM-DEXTROSE 2-4 GM/100ML-% IV SOLN
2.0000 g | Freq: Once | INTRAVENOUS | Status: AC
  Administered 2024-02-18: 2 g via INTRAVENOUS

## 2024-02-18 MED ORDER — ONDANSETRON HCL 4 MG PO TABS: 4.0000 mg | ORAL_TABLET | Freq: Four times a day (QID) | ORAL | Status: DC | PRN

## 2024-02-18 MED ORDER — CEFAZOLIN SODIUM-DEXTROSE 2-4 GM/100ML-% IV SOLN
INTRAVENOUS | Status: AC
  Filled 2024-02-18: qty 100

## 2024-02-18 MED ORDER — PROPOFOL 10 MG/ML IV BOLUS
INTRAVENOUS | Status: AC
  Filled 2024-02-18: qty 20

## 2024-02-18 MED ORDER — CEFAZOLIN SODIUM-DEXTROSE 2-4 GM/100ML-% IV SOLN
2.0000 g | INTRAVENOUS | Status: AC
  Administered 2024-02-18: 2 g via INTRAVENOUS

## 2024-02-18 MED ORDER — ACETAMINOPHEN 500 MG PO TABS: 1000.0000 mg | ORAL_TABLET | Freq: Three times a day (TID) | ORAL | 0 refills | Status: AC

## 2024-02-18 MED ORDER — OXYCODONE HCL 5 MG PO TABS: 2.5000 mg | ORAL_TABLET | Freq: Four times a day (QID) | ORAL | 0 refills | Status: AC | PRN

## 2024-02-18 MED ORDER — PHENYLEPHRINE HCL-NACL 20-0.9 MG/250ML-% IV SOLN
INTRAVENOUS | Status: DC | PRN
  Administered 2024-02-18: 30 ug/min via INTRAVENOUS

## 2024-02-18 MED ORDER — OXYCODONE HCL 5 MG PO TABS
ORAL_TABLET | ORAL | Status: AC
  Filled 2024-02-18: qty 1

## 2024-02-18 MED ORDER — MIDAZOLAM HCL 2 MG/2ML IJ SOLN
INTRAMUSCULAR | Status: AC
  Filled 2024-02-18: qty 2

## 2024-02-18 MED ORDER — LACTATED RINGERS IV SOLN: INTRAVENOUS | Status: DC

## 2024-02-18 MED ORDER — ONDANSETRON HCL 4 MG/2ML IJ SOLN
INTRAMUSCULAR | Status: DC | PRN
  Administered 2024-02-18: 4 mg via INTRAVENOUS

## 2024-02-18 MED ORDER — FENTANYL CITRATE (PF) 100 MCG/2ML IJ SOLN: 25.0000 ug | INTRAMUSCULAR | Status: DC | PRN

## 2024-02-18 MED ORDER — ASPIRIN 81 MG PO TBEC: 81.0000 mg | DELAYED_RELEASE_TABLET | Freq: Two times a day (BID) | ORAL | 0 refills | Status: AC

## 2024-02-18 MED ORDER — DEXAMETHASONE SODIUM PHOSPHATE 10 MG/ML IJ SOLN
8.0000 mg | Freq: Once | INTRAMUSCULAR | Status: AC
  Administered 2024-02-18: 8 mg via INTRAVENOUS

## 2024-02-18 MED ORDER — HYDROCODONE-ACETAMINOPHEN 5-325 MG PO TABS: 1.0000 | ORAL_TABLET | ORAL | Status: DC | PRN

## 2024-02-18 MED ORDER — MORPHINE SULFATE (PF) 2 MG/ML IV SOLN: 0.5000 mg | INTRAVENOUS | Status: DC | PRN

## 2024-02-18 MED ORDER — ACETAMINOPHEN 10 MG/ML IV SOLN
INTRAVENOUS | Status: DC | PRN
  Administered 2024-02-18: 1000 mg via INTRAVENOUS

## 2024-02-18 MED ORDER — PHENYLEPHRINE HCL-NACL 20-0.9 MG/250ML-% IV SOLN
INTRAVENOUS | Status: AC
Start: 2024-02-18 — End: 2024-02-18
  Filled 2024-02-18: qty 250

## 2024-02-18 MED ORDER — TRANEXAMIC ACID-NACL 1000-0.7 MG/100ML-% IV SOLN
INTRAVENOUS | Status: AC
  Filled 2024-02-18: qty 100

## 2024-02-18 MED ORDER — ACETAMINOPHEN 10 MG/ML IV SOLN
INTRAVENOUS | Status: AC
Start: 2024-02-18 — End: 2024-02-18
  Filled 2024-02-18: qty 100

## 2024-02-18 MED ORDER — BUPIVACAINE HCL (PF) 0.5 % IJ SOLN
INTRAMUSCULAR | Status: DC | PRN
  Administered 2024-02-18: 2.6 mL

## 2024-02-18 MED ORDER — PROPOFOL 1000 MG/100ML IV EMUL
INTRAVENOUS | Status: AC
  Filled 2024-02-18: qty 100

## 2024-02-18 MED ORDER — PROPOFOL 10 MG/ML IV BOLUS
INTRAVENOUS | Status: DC | PRN
  Administered 2024-02-18: 30 mg via INTRAVENOUS

## 2024-02-18 MED ORDER — TRAMADOL HCL 50 MG PO TABS: 50.0000 mg | ORAL_TABLET | Freq: Four times a day (QID) | ORAL | 0 refills | Status: AC | PRN

## 2024-02-18 MED ORDER — ACETAMINOPHEN 500 MG PO TABS: 500.0000 mg | ORAL_TABLET | Freq: Four times a day (QID) | ORAL | Status: DC

## 2024-02-18 MED ORDER — SODIUM CHLORIDE 0.9 % IR SOLN
Status: DC | PRN
  Administered 2024-02-18: 1000 mL

## 2024-02-18 MED ORDER — CEFAZOLIN SODIUM-DEXTROSE 2-4 GM/100ML-% IV SOLN
INTRAVENOUS | Status: AC
Start: 2024-02-18 — End: 2024-02-18
  Filled 2024-02-18: qty 100

## 2024-02-18 MED ORDER — HYDROCODONE-ACETAMINOPHEN 7.5-325 MG PO TABS: 1.0000 | ORAL_TABLET | ORAL | Status: DC | PRN

## 2024-02-18 SURGICAL SUPPLY — 53 items
BEARING MENISCAL TIBIAL 3 SM L (Orthopedic Implant) IMPLANT
BNDG ELASTIC 6INX 5YD STR LF (GAUZE/BANDAGES/DRESSINGS) ×2 IMPLANT
BOWL CEMENT MIX W/ADAPTER (MISCELLANEOUS) ×2 IMPLANT
BRUSH SCRUB EZ PLAIN DRY (MISCELLANEOUS) ×2 IMPLANT
CEMENT BONE R 1X40 (Cement) ×2 IMPLANT
CHLORAPREP W/TINT 26 (MISCELLANEOUS) ×4 IMPLANT
COOLER ICEMAN CLASSIC (MISCELLANEOUS) ×2 IMPLANT
CUFF TRNQT CYL 24X4X16.5-23 (TOURNIQUET CUFF) IMPLANT
CUFF TRNQT CYL 30X4X21-28X (TOURNIQUET CUFF) IMPLANT
DERMABOND ADVANCED .7 DNX12 (GAUZE/BANDAGES/DRESSINGS) ×2 IMPLANT
DRAPE ARTHROSCOPY W/POUCH 90 (DRAPES) ×2 IMPLANT
DRAPE SHEET LG 3/4 BI-LAMINATE (DRAPES) ×4 IMPLANT
DRSG MEPILEX SACRM 8.7X9.8 (GAUZE/BANDAGES/DRESSINGS) ×2 IMPLANT
DRSG OPSITE POSTOP 4X10 (GAUZE/BANDAGES/DRESSINGS) IMPLANT
DRSG OPSITE POSTOP 4X8 (GAUZE/BANDAGES/DRESSINGS) IMPLANT
ELECTRODE REM PT RTRN 9FT ADLT (ELECTROSURGICAL) ×2 IMPLANT
GLOVE BIO SURGEON STRL SZ8 (GLOVE) ×2 IMPLANT
GLOVE BIOGEL PI IND STRL 8 (GLOVE) ×2 IMPLANT
GLOVE PI ORTHO PRO STRL 7.5 (GLOVE) ×4 IMPLANT
GLOVE PI ORTHO PRO STRL SZ8 (GLOVE) ×4 IMPLANT
GLOVE SURG SYN 7.5 E (GLOVE) ×2 IMPLANT
GLOVE SURG SYN 7.5 PF PI (GLOVE) ×2 IMPLANT
GOWN SRG XL LVL 3 NONREINFORCE (GOWNS) ×2 IMPLANT
GOWN STRL REUS W/ TWL LRG LVL3 (GOWN DISPOSABLE) ×2 IMPLANT
GOWN STRL REUS W/ TWL XL LVL3 (GOWN DISPOSABLE) ×2 IMPLANT
HOOD PEEL AWAY T7 (MISCELLANEOUS) ×4 IMPLANT
INSERT TIBIAL OXFORD SZ B LF (Joint) IMPLANT
IV NS 1000ML BAXH (IV SOLUTION) ×2 IMPLANT
KIT TURNOVER KIT A (KITS) ×2 IMPLANT
MANIFOLD NEPTUNE II (INSTRUMENTS) ×2 IMPLANT
MAT ABSORB FLUID 56X50 GRAY (MISCELLANEOUS) ×2 IMPLANT
NDL HYPO 21X1.5 SAFETY (NEEDLE) ×2 IMPLANT
NEEDLE HYPO 21X1.5 SAFETY (NEEDLE) ×1 IMPLANT
PACK BLADE SAW RECIP 70 3 PT (BLADE) IMPLANT
PACK TOTAL KNEE (MISCELLANEOUS) ×2 IMPLANT
PAD ARMBOARD POSITIONER FOAM (MISCELLANEOUS) ×6 IMPLANT
PAD COLD UNI WRAP-ON (PAD) ×2 IMPLANT
PEG FEMORAL PEGGED STRL SM (Knees) IMPLANT
PENCIL SMOKE EVACUATOR (MISCELLANEOUS) ×2 IMPLANT
PIN DRILL HDLS TROCAR 75 4PK (PIN) IMPLANT
SLEEVE SCD COMPRESS KNEE MED (STOCKING) ×2 IMPLANT
SOLUTION IRRIG SURGIPHOR (IV SOLUTION) ×2 IMPLANT
SUCTION TUBE FRAZIER 10FR DISP (SUCTIONS) ×2 IMPLANT
SUT STRATAFIX 14 PDO 36 VLT (SUTURE) ×2 IMPLANT
SUT VIC AB 0 CT1 36 (SUTURE) ×2 IMPLANT
SUT VIC AB 2-0 CT2 27 (SUTURE) ×4 IMPLANT
SUTURE STRATA SPIR 4-0 18 (SUTURE) ×2 IMPLANT
SUTURE VICRYL 1-0 27IN ABS (SUTURE) ×2 IMPLANT
SYR 20ML LL LF (SYRINGE) ×4 IMPLANT
TIP FAN IRRIG PULSAVAC PLUS (DISPOSABLE) ×2 IMPLANT
TOWEL OR 17X26 4PK STRL BLUE (TOWEL DISPOSABLE) IMPLANT
TRAP FLUID SMOKE EVACUATOR (MISCELLANEOUS) ×2 IMPLANT
WATER STERILE IRR 1000ML POUR (IV SOLUTION) ×2 IMPLANT

## 2024-02-18 NOTE — Interval H&P Note (Signed)
 Patient history and physical updated. Consent reviewed including risks, benefits, and alternatives to surgery. Patient agrees with above plan to proceed with plan for left knee partial medial unicompartmental knee replacement versus total knee replacement.

## 2024-02-18 NOTE — Anesthesia Procedure Notes (Signed)
 Spinal  Patient location during procedure: OR Start time: 02/18/2024 10:45 AM End time: 02/18/2024 10:50 AM Reason for block: surgical anesthesia Staffing Performed: resident/CRNA  Performed by: Duwayne Craven, CRNA Authorized by: Leavy Ned, MD   Preanesthetic Checklist Completed: patient identified, IV checked, site marked, risks and benefits discussed, surgical consent, monitors and equipment checked, pre-op evaluation and timeout performed Spinal Block Patient position: sitting Prep: DuraPrep Patient monitoring: heart rate, cardiac monitor, continuous pulse ox and blood pressure Approach: midline Location: L3-4 Injection technique: single-shot Needle Needle type: Pencan  Needle gauge: 24 G Needle length: 9 cm Assessment Sensory level: T4 Events: CSF return

## 2024-02-18 NOTE — Op Note (Signed)
 Patient Name: Brianna Abbott  FMW:[id number]  Pre-Operative Diagnosis: Left knee medial compartment osteoarthritis  Post-Operative Diagnosis: (same)  Procedure: Left medial partial knee Arthroplasty  Components/Implants: Femur: Oxford Small twin peg    Tibia: Oxfort Size B  Poly: Anamtomic meniscal bearing small 3mm    Date of Surgery: 02/18/2024  Surgeon: Arthea Sheer MD  Assistant: Debby Amber PA (present and scrubbed throughout the case, critical for assistance with exposure, retraction, instrumentation, and closure)   Anesthesiologist: Leavy  Anesthesia: Spinal   Tourniquet Time: 82 min  EBL: 25  IVF: 700  Complications: None   Brief history: The patient is a 48 year old female with a history of isolated medial compartment osteoarthritis of the left knee with pain limiting their range of motion and activities of daily living, which has failed multiple attempts at conservative therapy.  The risks and benefits of medial partial knee arthroplasty as definitive surgical treatment were discussed with the patient, who opted to proceed with the operation.  After outpatient medical clearance and optimization was completed the patient was admitted to [facility] for the procedure.  All preoperative films were reviewed and an appropriate surgical plan was made prior to surgery. Preoperative range of motion was 0 to 115  Description of procedure: The patient was brought to the operating room where laterality was confirmed by all those present to be the left side.   Spinal anesthesia was administered and the patient received an intravenous dose of antibiotics for surgical prophylaxis and a dose of tranexamic acid .  Patient is positioned supine on the operating room table with all bony prominences well-padded with a leg holder positioned out to the side of the table to allow the leg to be moved from the table up to a hanging position of the thigh. A well-padded  tourniquet was applied to the left thigh.  The knee was then prepped and draped in usual sterile fashion with multiple layers of adhesive and nonadhesive drapes.  All of those present in the operating room participated in a surgical timeout laterality and patient were confirmed.   An Esmarch was wrapped around the extremity and the leg was elevated and the knee flexed.  The tourniquet was inflated to a pressure of 275 mmHg. The Esmarch was removed and the leg was brought down to full extension.  The patella and tibial tubercle identified and outlined using a marking pen and a skin incision was made just medial to midline with a knife carried through the subcutaneous tissue down to the extensor retinaculum.  After exposure of the extensor mechanism the medial parapatellar arthrotomy from the mid pole of the patella down to the tibia was performed with a scalpel and electrocautery extending down medial and distal to the tibial tubercle taking care to avoid incising the patellar tendon.   A small medial release was performed over the proximal tibia.  The knee was brought into extension in order to excise the fat pad taking care not to damage the patella tendon or any lateral structures.  The ACL was examined at this time and found to be intact as was the PCL. Isolated arthritic changes were noted to the medial compartment with no cartilage damage noted in the lateral compartment with minimal degenerative changes within the patellofemoral compartment.  At this time the decision was made to proceed with isolated medial partial knee arthroplasty.  Osteophytes were removed from around the notch and along the medial femoral condyle.  A J tool was then used to measure  the femoral condyle and the patient was found to have a size small femoral condyle.  An extramedullary tibial cutting guide was then applied to the leg with a ankle clamp placed around the distal tibia just above the malleoli the extramedullary jig was  aligned to match the anterior tibial crest and was then attached to the G clamp and the J tool aligning it with the proximal tibia for an appropriate resection.  The tibial jig was then pinned to the tibia with 2 pins and a Kocher was applied.  The position of the guide was then checked and it was in appropriate position for varus valgus alignment and posterior slope.  A sagittal saw was then used to cut a vertical cut just lateral to the tibial spine in line with the center arc of motion of the knee aiming at the ASIS.  Care was taken not to plunge into the tibia or posteriorly.  An oscillating saw was then used to carefully resect the medial tibia and the bone fragment was removed with a Kocher.   Careful attention was paid to ensure the blade did not disrupt any of the soft tissues including any medial ligaments and retractors were in place during cutting.    Attention was then turned to the femur, with retractors in place protecting medial lateral tissues and with the knee slightly flexed a opening drill was used to enter the medullary canal of the femur just lateral to the medial border of the medial femoral condyle and several millimeters proximal to the PCL insertion.  A hand awl was then used to widen the hole and a intramedullary guide was then placed.  The femoral drill guide was then placed under the posterior femur and attached to the intramedullary guide with the IM link connector.  Care was taken to ensure that the femoral drill guide was aligned with the weightbearing track along the distal femoral condyle.  A 4 mm drill was then placed in the upper hole and left in place followed by a 6 mm drill in the posterior hole.  Both the drill bits were removed and the femoral guide was removed.  The posterior femoral cut guide was then applied and a distal femur tapped into place and an oscillating saw was used to perform the posterior resection.  A 0 spigot was then placed in the central hole and the  condyle was milled.  A trial femoral component was then placed and a spacer block was used to check flexion gap and extension gap.  The extension gap was found to be 4 mm tighter then the flexion gap and the spigot was changed for a 4 spigot and the bone was milled again down to the stop.  The trials were then replaced and a spacer block was used and the flexion extension gaps were found to be balanced.    The antiimpingement guide was then applied to the femur and anterior milling was performed and posterior osteophyte resection.  The tibial prep tray was then placed and found to have good coverage without any overhang medial or anteriorly.  A feeler tool was used to align the tibial component with the posterior cortex and it was pinned into place.  A toothbrush saw was then used to clear out the keel and a scraper was used to clear out the keel tract.  A trial tibial component was then impacted into place found to have good coverage without overhang.  The femoral trial component was then placed and  a trial mobile-bearing was placed.  Knee was found to be stable through range of motion from flexion to extension with the trial implants.  No gapping or mid flexion instability was appreciated component was nice and snug through range of motion.  The correct final components for implantation were confirmed and opened by the circulator nurse.  The prepared surfaces of the femur and tibia were cleaned with pulsatile lavage to remove all blood fat and other material and then the surfaces were dried.  1 bag of cement were mixed under vacuum and the components were cemented into place.  Excess cement was removed with curettes and forceps. A trial polyethylene tibial component was placed and the knee was brought into 45 degrees extension to allow the cement to set.  At this time the periarticular injection cocktail was placed in the soft tissues surrounding the knee.  After full curing of the cement the balance of the  knee was checked again and the final polyethylene size was confirmed.  The knee was irrigated remove any debris.  The real polyethylene tibial component was implanted and the knee was brought through a range of motion and found to be well-balanced.   The knee was then irrigated with copious amount of normal saline via pulsatile lavage to remove all loose bodies and other debris.  The knee was then irrigated with surgiphor betadine based wash and reirrigated with saline.  The tourniquet was then dropped and all bleeding vessels were identified and coagulated.  The arthrotomy was approximated with #1 Vicryl and closed with #1 Stratafix suture.  The knee was brought into slight flexion and the subcutaneous tissues were closed with 0 Vicryl, 2-0 Vicryl and a running subcuticular 4-0 stratafix barbed suture.  Skin was then glued with Dermabond.  A sterile adhesive dressing was then placed along with a sequential compression device to the calf, a Ted stocking, and a cryotherapy cuff.   Sponge, needle, and Lap counts were all correct at the end of the case.   The patient was transferred off of the operating room table to a hospital bed, good pulses were found distally on the operative side.  The patient was transferred to the recovery room in stable condition.

## 2024-02-18 NOTE — Anesthesia Preprocedure Evaluation (Signed)
 Anesthesia Evaluation  Patient identified by MRN, date of birth, ID band Patient awake    Reviewed: Allergy & Precautions, NPO status , Patient's Chart, lab work & pertinent test results  History of Anesthesia Complications (+) PONV and history of anesthetic complications  Airway Mallampati: III  TM Distance: >3 FB Neck ROM: full    Dental  (+) Chipped, Dental Advidsory Given   Pulmonary neg pulmonary ROS, former smoker   Pulmonary exam normal        Cardiovascular hypertension, negative cardio ROS Normal cardiovascular exam     Neuro/Psych  PSYCHIATRIC DISORDERS Anxiety Depression    negative neurological ROS     GI/Hepatic negative GI ROS, Neg liver ROS,,,  Endo/Other  negative endocrine ROS    Renal/GU negative Renal ROS  negative genitourinary   Musculoskeletal   Abdominal   Peds  Hematology negative hematology ROS (+)   Anesthesia Other Findings Past Medical History: No date: Anxiety No date: Depression No date: Hypertension No date: PONV (postoperative nausea and vomiting)  Past Surgical History: No date: ABDOMINAL HYSTERECTOMY No date: OOPHORECTOMY  BMI    Body Mass Index: 36.39 kg/m      Reproductive/Obstetrics negative OB ROS                              Anesthesia Physical Anesthesia Plan  ASA: 2  Anesthesia Plan: Spinal   Post-op Pain Management:    Induction:   PONV Risk Score and Plan: 4 or greater and Propofol  infusion, TIVA, Midazolam , Dexamethasone  and Ondansetron   Airway Management Planned: Natural Airway and Nasal Cannula  Additional Equipment:   Intra-op Plan:   Post-operative Plan:   Informed Consent: I have reviewed the patients History and Physical, chart, labs and discussed the procedure including the risks, benefits and alternatives for the proposed anesthesia with the patient or authorized representative who has indicated his/her  understanding and acceptance.     Dental Advisory Given  Plan Discussed with: Anesthesiologist, CRNA and Surgeon  Anesthesia Plan Comments: (Patient reports no bleeding problems and no anticoagulant use.  Plan for spinal with backup GA  Patient consented for risks of anesthesia including but not limited to:  - adverse reactions to medications - damage to eyes, teeth, lips or other oral mucosa - nerve damage due to positioning  - risk of bleeding, infection and or nerve damage from spinal that could lead to paralysis - risk of headache or failed spinal - damage to teeth, lips or other oral mucosa - sore throat or hoarseness - damage to heart, brain, nerves, lungs, other parts of body or loss of life  Patient voiced understanding and assent.)        Anesthesia Quick Evaluation

## 2024-02-18 NOTE — Transfer of Care (Signed)
 Immediate Anesthesia Transfer of Care Note  Patient: Brianna Abbott  Procedure(s) Performed: ARTHROPLASTY, KNEE, UNICOMPARTMENTAL (Left: Knee)  Patient Location: PACU  Anesthesia Type:Spinal  Level of Consciousness: awake, alert , and oriented  Airway & Oxygen Therapy: Patient Spontanous Breathing and Patient connected to face mask oxygen  Post-op Assessment: Report given to RN and Post -op Vital signs reviewed and stable  Post vital signs: Reviewed and stable  Last Vitals:  Vitals Value Taken Time  BP 100/68 02/18/24 13:30  Temp    Pulse 84 02/18/24 13:31  Resp 22 02/18/24 13:31  SpO2 99 % 02/18/24 13:31  Vitals shown include unfiled device data.  Last Pain:  Vitals:   02/18/24 0838  PainSc: 7          Complications: No notable events documented.

## 2024-02-18 NOTE — H&P (Signed)
 History of Present Illness: The patient is an 48 y.o. female seen in clinic today for follow-up evaluation prior to planned left knee surgery. The patient has ongoing severe pain over the medial aspect of her left knee up to a 10 out of 10 at its worst which has been recalcitrant to conservative treatments including anti-inflammatories, Tylenol , steroid injections, and a series of gel injections. Her left knee is functionally limiting her on a daily basis and she is ready for more permanent solution for her knee. The patient denies fevers, chills, numbness, tingling, shortness of breath, chest pain, recent illness, or any trauma.  Patient is a nondiabetic with an A1c of 5.9 and a BMI of 36 and has been completely off of smoking or other tobacco products for the last 4 weeks.  Past Medical History: Past Medical History:  Diagnosis Date  Anxiety  COVID-19 02/2020  Depression  Followed by [facility] for both psychiatitic & counseling care.  Elevated uric acid in blood 02/2023  Hypertension  Hypertriglyceridemia 04/22/2016  Mild elevation.  IGT (impaired glucose tolerance) 04/22/2016  Medial meniscus tear 2022  Horizontal tear posterior horn medial meniscus  Obesity (BMI 30-39.9)  Periodic limb movement disorder (PLMD) 2013  Per sleep study & did recommend tx, but this was not followed up.  Restless leg syndrome 12/22/2019  Suicide attempt (CMS/HHS-HCC) 03/14/2015  Hospitalized at [facility]. Suicide attempt via drug overdose with wellbutrin and sertraline.   Past Surgical History: Past Surgical History:  Procedure Laterality Date  Fibroma Excision Right 2024  From Rt inner cheek, done by oral surgery.  COLONOSCOPY 12/05/2022  FHx CP/Repeat 53yrs/TKT  HYSTERECTOMY  During bladder tack. No cancer or bleeding concern.  UTERINE SUSPENSION   Past Family History: Family History  Problem Relation Age of Onset  Depression Mother  Suicidality Mother  Attempt  Alcohol abuse  Father  Melanoma Father  High blood pressure (Hypertension) Father  No Known Problems Son  Colon cancer Paternal Uncle  74's  High blood pressure (Hypertension) Maternal Grandmother  Diabetes type II Maternal Grandmother  High blood pressure (Hypertension) Maternal Grandfather  Diabetes type II Maternal Grandfather  No Known Problems Son   Medications: Current Outpatient Medications  Medication Sig Dispense Refill  amLODIPine (NORVASC) 5 MG tablet take 1 tablet by mouth every day 90 tablet 1  ascorbic acid, vitamin C, (VITAMIN C) 500 MG tablet Take 500 mg by mouth once daily  buPROPion (WELLBUTRIN XL) 150 MG XL tablet Take 1 tablet (150 mg total) by mouth every morning 90 tablet 1  buPROPion (WELLBUTRIN XL) 300 MG XL tablet Take 1 tablet (300 mg total) by mouth every morning 90 tablet 1  busPIRone (BUSPAR) 15 MG tablet TAKE 1 TABLET BY MOUTH 2 TIMES DAILY. (Patient taking differently: Take 15 mg by mouth 2 (two) times daily 3 times daily) 180 tablet 1  calcium citrate-vitamin D3 (CITRACAL+D) 315 mg-5 mcg (200 unit) tablet Take 2 tablets by mouth 2 (two) times daily with meals 60 tablet 2  diazePAM (VALIUM) 5 MG tablet Take one tablet 30 minutes prior to MRI. Take 2nd tablet only if needed during procedure. Please ensure there is someone to drive you to and from MRI appointment if taking valium. 2 tablet 0  diclofenac (VOLTAREN) 75 MG EC tablet Take 1 tablet (75 mg total) by mouth 2 (two) times daily with meals 60 tablet 3  DULoxetine (CYMBALTA) 20 MG DR capsule Take 30 mg by mouth once daily  DULoxetine (CYMBALTA) 30 MG DR capsule  Take 20 mg by mouth 2 (two) times daily  escitalopram oxalate (LEXAPRO) 20 MG tablet take 1 tablet by mouth every day 90 tablet 1  gabapentin (NEURONTIN) 100 MG capsule TAKE 1 CAPSULE (100 MG TOTAL) BY MOUTH THREE TIMES DAILY. 270 capsule 4  Herbal Supplement Herbal Name: Tumeric Take 1 capsule by mouth twice daily  hydroCHLOROthiazide (HYDRODIURIL) 50 MG tablet  TAKE 1 TABLET BY MOUTH EVERY DAY 90 tablet 1  HYDROcodone -acetaminophen  (NORCO) 5-325 mg tablet 1/2-1 po bid prn 10 tablet 0  omeprazole (PRILOSEC OTC) 20 MG EC tablet Take 20 mg by mouth once daily  pramipexole (MIRAPEX) 1 MG tablet Take 1 tablet (1 mg total) by mouth at bedtime 90 tablet 0  predniSONE (DELTASONE) 5 MG tablet Take as directed - 6 day taper 21 tablet 0  propranoloL (INDERAL) 10 MG tablet TAKE 3 TABLETS (30 MG TOTAL) BY MOUTH EVERY MORNING 270 tablet 1  traMADoL  (ULTRAM ) 50 mg tablet 1/2-1 po bid prn 10 tablet 1   No current facility-administered medications for this visit.   Allergies: No Known Allergies   Visit Vitals: Vitals:  01/26/24 0956  BP: 130/80    Review of Systems:  A comprehensive 14 point ROS was performed, reviewed, and the pertinent orthopaedic findings are documented in the HPI.  Physical Exam: General/Constitutional: No apparent distress: well-nourished and well developed. Eyes: Pupils equal, round with synchronous movement. Pulmonary exam: Lungs clear to auscultation bilaterally no wheezing rales or rhonchi Cardiac exam: Regular rate and rhythm no obvious murmurs rubs or gallops. Integumentary: No impressive skin lesions present, except as noted in detailed exam. Neuro/Psych: Normal mood and affect, oriented to person, place and time.  Comprehensive Knee Exam: Gait Non-antalgic and fluid  Alignment Neutral   Inspection Right Left  Skin Normal appearance with no obvious deformity. No ecchymosis or erythema. Normal appearance with no obvious deformity. No ecchymosis or erythema.  Soft Tissue No focal soft tissue swelling No focal soft tissue swelling  Quad Atrophy None None   Palpation  Right Left  Tenderness Mild medial joint line tenderness Medial joint line tenderness to palpation  Crepitus No patellofemoral or tibiofemoral crepitus No patellofemoral or tibiofemoral crepitus  Effusion None None   Range of Motion Right Left  Flexion  0-125 0-115  Extension Full knee extension without hyperextension Full knee extension without hyperextension   Ligamentous Exam Right Left  Lachman 1+ 1+  Valgus 0 Normal Normal  Valgus 30 Normal Normal  Varus 0 Normal Normal  Varus 30 Normal Normal  Anterior Drawer 1+ 1+  Posterior Drawer Normal Normal   Meniscal Exam Right Left  Hyperflexion Test Negative Positive  Hyperextension Test Negative Positive  McMurray's Negative Positive   Neurovascular Right Left  Quadriceps Strength 5/5 5/5  Hamstring Strength 5/5 5/5  Hip Abductor Strength 4/5 4/5  Distal Motor Normal Normal  Distal Sensory Normal light touch sensation Normal light touch sensation  Distal Pulses Normal Normal    Imaging Studies: I reviewed a PA flexed weightbearing x-ray of the bilateral knees performed on 12/07/2023 images reviewed by myself. There is medial joint space narrowing in both knees with sclerosis and early osteophyte nation. Significant decrease in medial joint space compared to prior films. No fractures or dislocations noted lateral joint spaces well-maintained.  Three-view x-rays of the left knee performed 02/04/2023 images reviewed by myself. Patient has medial joint space narrowing with sclerosis and very early osteophyte formation along the medial tibia. No fractures or dislocations noted other joint spaces well-maintained.  MRI of the left knee performed 11/25/2023 images and report reviewed by myself. There is full-thickness cartilage loss within the medial compartment with subchondral edema and osteophyte formation. There is a tear of the medial meniscus off the root with medial displacement of the meniscal body. ACL appears intact with some mild degeneration PCL intact. Collaterals intact. Other joint spaces show intact cartilage. Agree with radiology interpretation full read below.  Narrative  MR KNEE WITHOUT IV CONTRAST LEFT  COMPARISON: Prior study 04/02/2023  CLINICAL HISTORY: Left  knee pain.  PULSE SEQUENCES: Ax PD FS, Sag T2 ACL, Sag PD FS, Cor PD FS & COR T1  FINDINGS: Bones: There is relatively stable moderate severe chondromalacia and arthrosis of the medial compartment. There is full-thickness cartilage loss and subchondral reactive edema in the medial tibial plateau and to a lesser extent the medial femoral condyle. The degree of edema slightly increased when compared to the prior examination. There is no visualized fracture. There is moderate sclerosis in the medial tibial plateau which is also significantly increased when compared the prior examination. There is a moderate-sized reactive joint effusion. There is mild thickening of the medial plica. Correlation for medial plical symptoms. Extensor mechanism is intact.  Ligaments: Stable thickening of the ACL consistent with mucoid degeneration. There are intact fibers. The PCL, fibular collateral ligament and MCL are grossly intact. There is stripping of the MCL from the proximal tibia which is unchanged. This is consistent with a chronic strain. There are intact fibers.  Menisci: There is a chronic tear of the root and posterior horn of the medial meniscus with medial displacement of the body. Findings have slightly progressed when compared with the prior examination.  IMPRESSION: Slight progression of degenerative arthrosis in the medial compartment with reactive edema and sclerosis of the medial compartment as above. There is a moderate-sized reactive joint effusion.  Slightly thickened medial plica which is relatively stable.  Tear at the root and posterior horn of the medial meniscus with mild medial displacement of the body. Findings have slightly progressed when compared to prior examination.  Chronic stripping of the MCL from the proximal tibia consistent with a chronic sprain. MCL is intact.  Electronically signed by: Norleen Satchel MD 12/03/2023 03:50 PM EDT RP Workstation:  [id number]  Assessment:  Left knee medial compartment arthritis, left knee medial meniscus tear  Plan: Arynn is a 48 year old female who presents with left knee osteoarthritis isolated to the medial joint space with a medial meniscal root tear. I reviewed the clinical and radiographic findings with the patient we discussed continued conservative options versus the potential for surgical intervention. Based upon the patient's continued symptoms and failure to respond to conservative treatment, I have recommended surgical intervention and we discussed partial knee replacement versus total knee replacement. A long discussion took place with the patient describing what a partial and total joint replacement are and what the procedures would entail. A knee model, similar to the implants that will be used during the operation, was utilized to demonstrate the implants. Choices of implant manufactures were discussed and reviewed. The ability to secure the implant utilizing cement or cementless (press fit) fixation was discussed. The approach and exposure was discussed.   The hospitalization and post-operative care and rehabilitation were also discussed. The use of perioperative antibiotics and DVT prophylaxis were discussed. The risk, benefits and alternatives to a surgical intervention were discussed at length with the patient. The patient was also advised of risks related to the medical comorbidities and elevated  body mass index (BMI). A lengthy discussion took place to review the most common complications including but not limited to: stiffness, loss of function, complex regional pain syndrome, deep vein thrombosis, pulmonary embolus, heart attack, stroke, infection, wound breakdown, numbness, intraoperative fracture, damage to nerves, tendon,muscles, arteries or other blood vessels, death and other possible complications from anesthesia. The patient was told that we will take steps to minimize these risks by  using sterile technique, antibiotics and DVT prophylaxis when appropriate and follow the patient postoperatively in the office setting to monitor progress. The possibility of recurrent pain, no improvement in pain and actual worsening of pain were also discussed with the patient.   Patient asked about and confirms no history of any reactions to metal or metal allergy in the past.  The discharge plan of care focused on the patient going home following surgery. The patient was encouraged to make the necessary arrangements to have someone stay with them when they are discharged home.   The benefits of surgery were discussed with the patient including the potential for improving the patient's current clinical condition through operative intervention. Alternatives to surgical intervention including continued conservative management were also discussed in detail. All questions were answered to the satisfaction of the patient. The patient participated and agreed to the plan of care as well as the use of the recommended implants for their total knee replacement surgery. An information packet was given to the patient to review prior to surgery.   The patient received clearance for surgery. We reviewed that the decision between partial knee replacement versus total knee replacement would be made intraoperatively based on the competency of the ACL and the presence of degenerative changes to the other joint spaces. Patient is okay with proceeding with the planned for partial knee replacement with an intraoperative potential for conversion to total knee replacement. The patient has stayed off of tobacco products and nicotine  and was off more than 6 weeks prior to surgery. All questions answered patient agrees with the above plan.    Portions of this record have been created using Scientist, clinical (histocompatibility and immunogenetics). Dictation errors have been sought, but may not have been identified and corrected.  Arthea Sheer MD

## 2024-02-18 NOTE — Evaluation (Signed)
 Physical Therapy Evaluation Patient Details Name: Brianna Abbott MRN: [id number] DOB: [birth year] Today's Date: 02/18/2024  History of Present Illness  Pt is a 48 y.o. female s/p 02/18/24 L medial partial knee arthroplasty d/t L knee medial compartment OA.  PMH includes htn, anxiety, depression, PONV, abdominal hysterectomy, periodic limb movement disorder, RLS.  Clinical Impression  Prior to surgery, pt reports being independent with functional mobility (although limited d/t L knee impairments/pain); lives with her husband and 2 adult sons in 1 level home with 2 STE B railings.  L knee pain 3/10 beginning/end of session at rest.  Pt performed LE ex's and issued written TKA HEP handout (pt demonstrating and appearing with appropriate understanding).  Currently pt is SBA semi-supine to sitting EOB; CGA with transfer from bed; CGA with ambulation 100 feet with RW use; and CGA navigating 4 steps with B UE support on railing.  No loss of balance noted during sessions activities.  Pt and pt's husband educated on fall prevention, home safety, and safe car transfers: both appearing with appropriate understanding.  Pt would currently benefit from skilled PT to address noted impairments and functional limitations (see below for any additional details).  Upon hospital discharge, pt would benefit from ongoing therapy (pt reports plan for HHPT).    If plan is discharge home, recommend the following: A little help with walking and/or transfers;A little help with bathing/dressing/bathroom;Assistance with cooking/housework;Assist for transportation;Help with stairs or ramp for entrance   Can travel by private vehicle    Yes    Equipment Recommendations Rolling walker (2 wheels);BSC/3in1  Recommendations for Other Services       Functional Status Assessment Patient has had a recent decline in their functional status and demonstrates the ability to make significant improvements in function in a reasonable and  predictable amount of time.     Precautions / Restrictions Precautions Precautions: Fall;Knee Precaution Booklet Issued: Yes (comment) Recall of Precautions/Restrictions: Intact Restrictions Weight Bearing Restrictions Per Provider Order: Yes LLE Weight Bearing Per Provider Order: Weight bearing as tolerated      Mobility  Bed Mobility Overal bed mobility: Needs Assistance Bed Mobility: Supine to Sit     Supine to sit: Supervision, HOB elevated          Transfers Overall transfer level: Needs assistance Equipment used: Rolling walker (2 wheels) Transfers: Sit to/from Stand Sit to Stand: Contact guard assist           General transfer comment: initial vc's for UE/LE positioning and overall technique    Ambulation/Gait Ambulation/Gait assistance: Contact guard assist Gait Distance (Feet): 100 Feet Assistive device: Rolling walker (2 wheels)   Gait velocity: decreased     General Gait Details: antalgic; decreased stance time L LE; step to gait pattern; steady ambulation  Stairs Stairs: Yes Stairs assistance: Contact guard assist Stair Management: One rail Left, Step to pattern, Sideways Number of Stairs: 4 General stair comments: initial vc's for overall technique and LE sequencing; steady safe stairs navigation  Wheelchair Mobility     Tilt Bed    Modified Rankin (Stroke Patients Only)       Balance Overall balance assessment: Needs assistance Sitting-balance support: No upper extremity supported, Feet supported Sitting balance-Leahy Scale: Normal Sitting balance - Comments: steady reaching outside BOS   Standing balance support: Bilateral upper extremity supported, During functional activity, Reliant on assistive device for balance Standing balance-Leahy Scale: Good Standing balance comment: steady ambulating with RW use  Pertinent Vitals/Pain Pain Assessment Pain Assessment: 0-10 Pain Score: 3  Pain  Location: L knee Pain Descriptors / Indicators: Sore Pain Intervention(s): Limited activity within patient's tolerance, Monitored during session, Premedicated before session, Repositioned, Other (comment) (Polar care applied; pt declined pain medication) Vitals (HR and SpO2 on room air) stable during session.    Home Living Family/patient expects to be discharged to:: Private residence Living Arrangements: Spouse/significant other;Children (Pt's husband and 2 adult sons (71 and 10 y.o.)) Available Help at Discharge: Family;Available PRN/intermittently Type of Home: House Home Access: Stairs to enter Entrance Stairs-Rails: Doctor, general practice of Steps: 2   Home Layout: One level Home Equipment: Grab bars - tub/shower;Shower seat - built in Web designer)      Prior Function Prior Level of Function : Independent/Modified Independent             Mobility Comments: Independent with ambulation (limited d/t L knee impairments); 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 ADLs Comments: Independent     Extremity/Trunk Assessment   Upper Extremity Assessment Upper Extremity Assessment: Overall WFL for tasks assessed    Lower Extremity Assessment Lower Extremity Assessment: LLE deficits/detail (R LE WFL) LLE Deficits / Details: able to perform L LE SLR independently; at least 3/5 AROM hip flexion, knee flexion/extension (although limited ROM d/t knee pain), and DF/PF LLE: Unable to fully assess due to pain    Cervical / Trunk Assessment Cervical / Trunk Assessment: Normal  Communication   Communication Communication: No apparent difficulties    Cognition Arousal: Alert Behavior During Therapy: WFL for tasks assessed/performed   PT - Cognitive impairments: No apparent impairments                         Following commands: Intact       Cueing Cueing Techniques: Verbal cues     General Comments General comments (skin integrity, edema, etc.): L knee  dressing in place (no drainage noted).  Nursing cleared pt for participation in physical therapy.  Pt agreeable to PT session.  Pt's husband present entire session.    Exercises Total Joint Exercises Ankle Circles/Pumps: AROM, Strengthening, Both, 10 reps, Supine Quad Sets: AROM, Strengthening, Both, 10 reps, Supine Short Arc Quad: AROM, Strengthening, Left, 10 reps, Supine Heel Slides: AAROM, Strengthening, Left, 10 reps, Supine Hip ABduction/ADduction: AAROM, Strengthening, Left, 10 reps, Supine Straight Leg Raises: AAROM, Strengthening, Left, 10 reps, Supine Long Arc Quad: AROM, Strengthening, Left, 10 reps, Seated Knee Flexion: AROM, Strengthening, Left, 10 reps, Seated Goniometric ROM: L knee AROM 0-100 degrees   Assessment/Plan    PT Assessment Patient needs continued PT services  PT Problem List Decreased strength;Decreased range of motion;Decreased activity tolerance;Decreased balance;Decreased mobility;Decreased knowledge of use of DME;Decreased knowledge of precautions;Decreased skin integrity;Pain       PT Treatment Interventions DME instruction;Gait training;Stair training;Functional mobility training;Therapeutic activities;Therapeutic exercise;Balance training;Patient/family education    PT Goals (Current goals can be found in the Care Plan section)  Acute Rehab PT Goals Patient Stated Goal: to improve pain and walking PT Goal Formulation: With patient/family Time For Goal Achievement: 03/03/24 Potential to Achieve Goals: Good    Frequency BID     Co-evaluation               AM-PAC PT 6 Clicks Mobility  Outcome Measure Help needed turning from your back to your side while in a flat bed without using bedrails?: None Help needed moving from lying on your back to sitting  on the side of a flat bed without using bedrails?: A Little Help needed moving to and from a bed to a chair (including a wheelchair)?: A Little Help needed standing up from a chair using  your arms (e.g., wheelchair or bedside chair)?: A Little Help needed to walk in hospital room?: A Little Help needed climbing 3-5 steps with a railing? : A Little 6 Click Score: 19    End of Session Equipment Utilized During Treatment: Gait belt Activity Tolerance: Patient tolerated treatment well Patient left: in chair;with family/visitor present;Other (comment) (polar care in place; pt's husband present; pt in site of nursing in post op) Nurse Communication: Mobility status;Precautions;Weight bearing status PT Visit Diagnosis: Other abnormalities of gait and mobility (R26.89);Muscle weakness (generalized) (M62.81);Pain Pain - Right/Left: Left Pain - part of body: Knee    Time: 8358-8278 PT Time Calculation (min) (ACUTE ONLY): 40 min   Charges:   PT Evaluation $PT Eval Low Complexity: 1 Low PT Treatments $Gait Training: 8-22 mins $Therapeutic Exercise: 8-22 mins PT General Charges $$ ACUTE PT VISIT: 1 Visit        Damien Caulk, PT 02/18/24, 5:53 PM

## 2024-02-18 NOTE — Progress Notes (Signed)
 Patient is not able to walk the distance required to go the bathroom, or she is unable to safely negotiate stairs required to access the bathroom.  A 3in1 BSC will alleviate this problem.       Lollie Marrow, PA-C [facility]

## 2024-02-18 NOTE — Discharge Instructions (Signed)
 Instructions after Partial Knee Replacement   Arthea Sheer M.D.     Dept. of Orthopaedics & Sports Medicine  [facility]  [address]  [place], [place]  [address]  Phone: [phone]   Fax: [phone]    DIET: Drink plenty of non-alcoholic fluids. Resume your normal diet. Include foods high in fiber.  ACTIVITY:  You may use crutches or a walker with weight-bearing as tolerated, unless instructed otherwise. You may be weaned off of the walker or crutches by your Physical Therapist.  Do NOT place pillows under the knee. Anything placed under the knee could limit your ability to straighten the knee.   Continue doing gentle exercises. Exercising will reduce the pain and swelling, increase motion, and prevent muscle weakness.   Please continue to use the TED compression stockings for 2 weeks. You may remove the stockings at night, but should reapply them in the morning. Do not drive or operate any equipment until instructed.  WOUND CARE:  Continue to use the PolarCare or ice packs periodically to reduce pain and swelling. You may begin showering 3 days after surgery with honeycomb dressing. Remove honeycomb dressing 7 days after surgery and continue showering. Allow dermabond to fall off on its own.  MEDICATIONS: You may resume your regular medications. Please take the pain medication as prescribed on the medication. Do not take pain medication on an empty stomach. Start aspirin  81 mg twice a day for 4 weeks Do not drive or drink alcoholic beverages when taking pain medications.  POSTOPERATIVE CONSTIPATION PROTOCOL Constipation - defined medically as fewer than three stools per week and severe constipation as less than one stool per week.  One of the most common issues patients have following surgery is constipation.  Even if you have a regular bowel pattern at home, your normal regimen is likely to be disrupted due to multiple reasons following surgery.   Combination of anesthesia, postoperative narcotics, change in appetite and fluid intake all can affect your bowels.  In order to avoid complications following surgery, here are some recommendations in order to help you during your recovery period.  Colace (docusate) - Pick up an over-the-counter form of Colace or another stool softener and take twice a day as long as you are requiring postoperative pain medications.  Take with a full glass of water daily.  If you experience loose stools or diarrhea, hold the colace until you stool forms back up.  If your symptoms do not get better within 1 week or if they get worse, check with your doctor.  Dulcolax (bisacodyl) - Pick up over-the-counter and take as directed by the product packaging as needed to assist with the movement of your bowels.  Take with a full glass of water.  Use this product as needed if not relieved by Colace only.   MiraLax (polyethylene glycol) - Pick up over-the-counter to have on hand.  MiraLax is a solution that will increase the amount of water in your bowels to assist with bowel movements.  Take as directed and can mix with a glass of water, juice, soda, coffee, or tea.  Take if you go more than two days without a movement. Do not use MiraLax more than once per day. Call your doctor if you are still constipated or irregular after using this medication for 7 days in a row.  If you continue to have problems with postoperative constipation, please contact the office for further assistance and recommendations.  If you experience the worst abdominal  pain ever or develop nausea or vomiting, please contact the office immediatly for further recommendations for treatment.   CALL THE OFFICE FOR: Temperature above 101 degrees Excessive bleeding or drainage on the dressing. Excessive swelling, coldness, or paleness of the toes. Persistent nausea and vomiting.  FOLLOW-UP:  You should have an appointment to return to the office in 14 days  after surgery. Arrangements have been made for continuation of Physical Therapy (either home therapy or outpatient therapy).

## 2024-02-19 ENCOUNTER — Encounter: Payer: Self-pay | Admitting: Orthopedic Surgery

## 2024-02-19 NOTE — Anesthesia Postprocedure Evaluation (Signed)
 Anesthesia Post Note  Patient: Brianna Abbott  Procedure(s) Performed: ARTHROPLASTY, KNEE, UNICOMPARTMENTAL (Left: Knee)  Anesthesia Type: Spinal Anesthetic complications: no Comments: Patient discharged to home prior to being seen by anesthesia.   No notable events documented.   Last Vitals:  Vitals:   02/18/24 1519 02/18/24 1734  BP: 105/81 115/77  Pulse:  95  Resp:  18  Temp:    SpO2:  95%    Last Pain:  Vitals:   02/18/24 1734  PainSc: 0-No pain                 Conni Knighton,  Delon HERO

## 2024-05-28 ENCOUNTER — Encounter: Payer: Self-pay | Admitting: Emergency Medicine

## 2024-05-28 ENCOUNTER — Ambulatory Visit
Admission: EM | Admit: 2024-05-28 | Discharge: 2024-05-28 | Disposition: A | Discharge status: Home / Self Care | Attending: Emergency Medicine | Admitting: Emergency Medicine

## 2024-05-28 DIAGNOSIS — J069 Acute upper respiratory infection, unspecified: Secondary | ICD-10-CM | POA: Diagnosis present

## 2024-05-28 LAB — RESP PANEL BY RT-PCR (FLU A&B, COVID) ARPGX2
Influenza A by PCR: NEGATIVE
Influenza B by PCR: NEGATIVE
SARS Coronavirus 2 by RT PCR: NEGATIVE

## 2024-05-28 LAB — GROUP A STREP BY PCR: Group A Strep by PCR: NOT DETECTED

## 2024-05-28 MED ORDER — BENZONATATE 100 MG PO CAPS: 200.0000 mg | ORAL_CAPSULE | Freq: Three times a day (TID) | ORAL | 0 refills | Status: AC

## 2024-05-28 MED ORDER — PROMETHAZINE-DM 6.25-15 MG/5ML PO SYRP: 5.0000 mL | ORAL_SOLUTION | Freq: Four times a day (QID) | ORAL | 0 refills | Status: AC | PRN

## 2024-05-28 MED ORDER — IPRATROPIUM BROMIDE 0.06 % NA SOLN: 2.0000 | Freq: Four times a day (QID) | NASAL | 12 refills | Status: AC

## 2024-05-28 NOTE — ED Provider Notes (Signed)
 [facility]    CSN: [id number] Arrival date & time: 05/28/24  1152      History   Chief Complaint Chief Complaint  Patient presents with   Cough   Sore Throat    HPI Brianna Abbott is a 48 y.o. female.   HPI  48 year old female with past medical history significant for hypertension, anxiety, and depression presents for evaluation of respiratory symptoms that began yesterday.  She is reporting a fever with a Tmax of 99.3, fatigue, nasal congestion, postnasal drip, sore throat, cough, shortness breath, and wheezing.  She reports that her son is also sick with similar symptoms.  She denies any travel out of state or country.  Past Medical History:  Diagnosis Date   Anxiety    Depression    Hypertension    PONV (postoperative nausea and vomiting)     There are no active problems to display for this patient.   Past Surgical History:  Procedure Laterality Date   ABDOMINAL HYSTERECTOMY     OOPHORECTOMY     PARTIAL KNEE ARTHROPLASTY Left 02/18/2024   Procedure: ARTHROPLASTY, KNEE, UNICOMPARTMENTAL;  Surgeon: Lorelle Hussar, MD;  Location: [facility] ORS;  Service: Orthopedics;  Laterality: Left;  Left partial knee replacement    OB History   No obstetric history on file.      Home Medications    Prior to Admission medications   Medication Sig Start Date End Date Taking? Authorizing Provider  benzonatate (TESSALON) 100 MG capsule Take 2 capsules (200 mg total) by mouth every 8 (eight) hours. 05/28/24  Yes Bernardino Ditch, NP  ipratropium (ATROVENT) 0.06 % nasal spray Place 2 sprays into both nostrils 4 (four) times daily. 05/28/24  Yes Bernardino Ditch, NP  promethazine-dextromethorphan (PROMETHAZINE-DM) 6.25-15 MG/5ML syrup Take 5 mLs by mouth 4 (four) times daily as needed. 05/28/24  Yes Bernardino Ditch, NP  acetaminophen  (TYLENOL ) 500 MG tablet Take 2 tablets (1,000 mg total) by mouth every 8 (eight) hours. 02/18/24   Charlene Debby BROCKS, PA-C  amLODipine (NORVASC) 5 MG  tablet Take 5 mg by mouth daily.    [provider]  Ascorbic Acid (VITAMIN C) 1000 MG tablet Take 1,000 mg by mouth daily.    [provider]  buPROPion (WELLBUTRIN XL) 150 MG 24 hr tablet Take 150 mg by mouth daily in the afternoon.    [provider]  buPROPion (WELLBUTRIN XL) 300 MG 24 hr tablet Take 300 mg by mouth every morning.    [provider]  busPIRone (BUSPAR) 15 MG tablet Take 15 mg by mouth 2 (two) times daily.    [provider]  Calcium Carb-Cholecalciferol (CALCIUM 500 + D PO) Take 1 tablet by mouth 2 (two) times daily.    [provider]  DULoxetine (CYMBALTA) 30 MG capsule Take 30 mg by mouth daily. 12/07/23   [provider]  ferrous sulfate 325 (65 FE) MG tablet Take 325 mg by mouth daily.    [provider]  folic acid (FOLVITE) 800 MCG tablet Take 800 mcg by mouth daily.    [provider]  gabapentin (NEURONTIN) 100 MG capsule Take 100 mg by mouth 3 (three) times daily. 01/11/24   [provider]  hydrochlorothiazide (HYDRODIURIL) 50 MG tablet Take 50 mg by mouth daily.    [provider]  loratadine (CLARITIN) 10 MG tablet Take 10 mg by mouth daily.    [provider]  Multiple Vitamins-Minerals (IMMUNE SUPPORT) CHEW Chew 2 tablets by mouth daily  as needed (immune support).    [provider]  omeprazole (PRILOSEC OTC) 20 MG tablet Take 20 mg by mouth daily.    [provider]  ondansetron  (ZOFRAN ) 4 MG tablet Take 1 tablet (4 mg total) by mouth every 8 (eight) hours as needed for nausea or vomiting. 02/18/24   Charlene Debby BROCKS, PA-C  oxyCODONE  (ROXICODONE ) 5 MG immediate release tablet Take 0.5-1 tablets (2.5-5 mg total) by mouth every 6 (six) hours as needed for breakthrough pain. 02/18/24 02/17/25  Charlene Debby BROCKS, PA-C  pramipexole (MIRAPEX) 1 MG tablet Take 1 mg by mouth at bedtime.    [provider]  propranolol (INDERAL) 10 MG tablet Take  30 mg by mouth every morning.    [provider]  sennosides-docusate sodium  (SENOKOT-S) 8.6-50 MG tablet Take 2 tablets by mouth daily. Prn constipation 02/18/24   Charlene Debby BROCKS, PA-C  simethicone (MYLICON) 125 MG chewable tablet Chew 250 mg by mouth every 6 (six) hours as needed for flatulence.    [provider]  traMADol  (ULTRAM ) 50 MG tablet Take 1 tablet (50 mg total) by mouth every 6 (six) hours as needed for moderate pain (pain score 4-6). 02/18/24   Gaines, Thomas C, PA-C  Turmeric 500 MG CAPS Take 500 mg by mouth 2 (two) times daily.    [provider]    Family History Family History  Problem Relation Age of Onset   Healthy Mother    Hypertension Father    Breast cancer Neg Hx     Social History Social History   Tobacco Use   Smoking status: Former    Types: Cigarettes, E-cigarettes   Smokeless tobacco: Never  Vaping Use   Vaping status: Never Used  Substance Use Topics   Alcohol use: Yes    Comment: socially   Drug use: Never     Allergies   Patient has no known allergies.   Review of Systems Review of Systems  Constitutional:  Positive for fatigue and fever.  HENT:  Positive for congestion, postnasal drip and sore throat. Negative for ear pain.   Respiratory:  Positive for cough, shortness of breath and wheezing.      Physical Exam Triage Vital Signs ED Triage Vitals  Encounter Vitals Group     BP 05/28/24 1258 124/89     Girls Systolic BP Percentile --      Girls Diastolic BP Percentile --      Boys Systolic BP Percentile --      Boys Diastolic BP Percentile --      Pulse Rate 05/28/24 1258 88     Resp 05/28/24 1258 16     Temp 05/28/24 1258 98.6 F (37 C)     Temp Source 05/28/24 1258 Oral     SpO2 05/28/24 1258 98 %     Weight 05/28/24 1256 212 lb 1.3 oz (96.2 kg)     Height 05/28/24 1256 5' 4 (1.626 m)     Head Circumference --      Peak Flow --      Pain Score 05/28/24 1256 8     Pain Loc --      Pain  Education --      Exclude from Growth Chart --    No data found.  Updated Vital Signs BP 124/89 (BP Location: Left Arm)   Pulse 88   Temp 98.6 F (37 C) (Oral)   Resp 16   Ht 5' 4 (1.626 m)   Wt  212 lb 1.3 oz (96.2 kg)   SpO2 98%   BMI 36.40 kg/m   Visual Acuity Right Eye Distance:   Left Eye Distance:   Bilateral Distance:    Right Eye Near:   Left Eye Near:    Bilateral Near:     Physical Exam Vitals and nursing note reviewed.  Constitutional:      Appearance: Normal appearance. She is not ill-appearing.  HENT:     Head: Normocephalic and atraumatic.     Right Ear: Tympanic membrane, ear canal and external ear normal. There is no impacted cerumen.     Left Ear: Tympanic membrane, ear canal and external ear normal. There is no impacted cerumen.     Nose: Congestion and rhinorrhea present.     Comments: Nasal mucosa is edematous and erythematous with clear discharge in both naris.    Mouth/Throat:     Mouth: Mucous membranes are moist.     Pharynx: Oropharynx is clear. Posterior oropharyngeal erythema present. No oropharyngeal exudate.     Comments: Erythema to the posterior oropharynx with clear postnasal drip. Cardiovascular:     Rate and Rhythm: Normal rate and regular rhythm.     Pulses: Normal pulses.     Heart sounds: Normal heart sounds. No murmur heard.    No friction rub. No gallop.  Pulmonary:     Effort: Pulmonary effort is normal.     Breath sounds: Normal breath sounds. No wheezing, rhonchi or rales.  Musculoskeletal:     Cervical back: Normal range of motion and neck supple. No tenderness.  Lymphadenopathy:     Cervical: No cervical adenopathy.  Skin:    General: Skin is warm and dry.     Capillary Refill: Capillary refill takes less than 2 seconds.     Findings: No rash.  Neurological:     General: No focal deficit present.     Mental Status: She is alert and oriented to person, place, and time.      UC Treatments / Results  Labs (all  labs ordered are listed, but only abnormal results are displayed) Labs Reviewed  GROUP A STREP BY PCR  RESP PANEL BY RT-PCR (FLU A&B, COVID) [id number]    EKG   Radiology No results found.  Procedures Procedures (including critical care time)  Medications Ordered in UC Medications - No data to display  Initial Impression / Assessment and Plan / UC Course  I have reviewed the triage vital signs and the nursing notes.  Pertinent labs & imaging results that were available during my care of the patient were reviewed by me and considered in my medical decision making (see chart for details).   Patient is a pleasant, nontoxic-appearing 48 year old female presenting for evaluation of flu/COVID-like symptoms that began yesterday as outlined in the HPI above.  She reports that her son has been sick with similar symptoms but he has not been tested.  She is endorsing shortness of breath and wheezing though she is able to speak in full sentence without dyspnea or tachypnea.  Respiratory rate at triage was 16 with a 98% room air oxygen saturation.  She is currently afebrile with an oral temp of 98.6.  Differential diagnose include COVID, influenza, strep pharyngitis, viral respiratory illness.  I will order a COVID and flu PCR as well as a strep PCR.  Strep PCR is negative.  Respiratory panel is negative for COVID or influenza.  I will discharge patient home with diagnosis of viral URI with cough.  I will prescribe Atrovent nasal spray for nasal congestion along with Tessalon Perles and Promethazine DM cough syrup for cough and congestion   Final Clinical Impressions(s) / UC Diagnoses   Final diagnoses:  Viral URI with cough     Discharge Instructions      Your testing today was negative for strep, COVID, or influenza.  However, your physical exam is consistent with a viral respiratory infection.  You may use over-the-counter Tylenol  and/or ibuprofen according to the package instructions  as needed for any fever or pain.Use the Atrovent nasal spray, 2 squirts in each nostril every 6 hours, as needed for runny nose and postnasal drip.  Use the Tessalon Perles every 8 hours during the day.  Take them with a small sip of water.  They may give you some numbness to the base of your tongue or a metallic taste in your mouth, this is normal.  Use the Promethazine DM cough syrup at bedtime for cough and congestion.  It will make you drowsy so do not take it during the day.  Return for reevaluation or see your primary care provider for any new or worsening symptoms.      ED Prescriptions     Medication Sig Dispense Auth. Provider   benzonatate (TESSALON) 100 MG capsule Take 2 capsules (200 mg total) by mouth every 8 (eight) hours. 21 capsule Bernardino Ditch, NP   ipratropium (ATROVENT) 0.06 % nasal spray Place 2 sprays into both nostrils 4 (four) times daily. 15 mL Bernardino Ditch, NP   promethazine-dextromethorphan (PROMETHAZINE-DM) 6.25-15 MG/5ML syrup Take 5 mLs by mouth 4 (four) times daily as needed. 118 mL Bernardino Ditch, NP      PDMP not reviewed this encounter.   Bernardino Ditch, NP 05/28/24 1353

## 2024-05-28 NOTE — ED Triage Notes (Signed)
 Patient reports cough, sore throat, nasal congestion, fatigue that started yesterday.  Patient reports low grade fevers.

## 2024-05-28 NOTE — Discharge Instructions (Signed)
 Your testing today was negative for strep, COVID, or influenza.  However, your physical exam is consistent with a viral respiratory infection.  You may use over-the-counter Tylenol  and/or ibuprofen according to the package instructions as needed for any fever or pain.Use the Atrovent nasal spray, 2 squirts in each nostril every 6 hours, as needed for runny nose and postnasal drip.  Use the Tessalon Perles every 8 hours during the day.  Take them with a small sip of water.  They may give you some numbness to the base of your tongue or a metallic taste in your mouth, this is normal.  Use the Promethazine DM cough syrup at bedtime for cough and congestion.  It will make you drowsy so do not take it during the day.  Return for reevaluation or see your primary care provider for any new or worsening symptoms.

## 2024-07-29 ENCOUNTER — Other Ambulatory Visit: Payer: Self-pay | Admitting: Orthopedic Surgery

## 2024-07-29 DIAGNOSIS — Z96652 Presence of left artificial knee joint: Secondary | ICD-10-CM

## 2024-07-31 ENCOUNTER — Ambulatory Visit
Admission: RE | Admit: 2024-07-31 | Discharge: 2024-07-31 | Disposition: A | Discharge status: Home / Self Care | Source: Ambulatory Visit | Attending: Orthopedic Surgery | Admitting: Orthopedic Surgery

## 2024-07-31 DIAGNOSIS — Z96652 Presence of left artificial knee joint: Secondary | ICD-10-CM | POA: Insufficient documentation
